# Patient Record
Sex: Female | Born: 1975 | Race: Black or African American | Hispanic: No | Marital: Single | Smoking: Current every day smoker
Health system: Southern US, Community
[De-identification: ages and names within clinical notes are randomized; demographics above are authoritative.]

## PROBLEM LIST (undated history)

## (undated) DIAGNOSIS — E119 Type 2 diabetes mellitus without complications: Secondary | ICD-10-CM

## (undated) DIAGNOSIS — E785 Hyperlipidemia, unspecified: Secondary | ICD-10-CM

## (undated) DIAGNOSIS — I10 Essential (primary) hypertension: Secondary | ICD-10-CM

---

## 2017-07-17 ENCOUNTER — Encounter (HOSPITAL_COMMUNITY): Payer: Self-pay | Admitting: Emergency Medicine

## 2017-07-17 ENCOUNTER — Other Ambulatory Visit: Payer: Self-pay

## 2017-07-17 DIAGNOSIS — D649 Anemia, unspecified: Secondary | ICD-10-CM | POA: Diagnosis present

## 2017-07-17 DIAGNOSIS — Z794 Long term (current) use of insulin: Secondary | ICD-10-CM

## 2017-07-17 DIAGNOSIS — I10 Essential (primary) hypertension: Secondary | ICD-10-CM | POA: Diagnosis present

## 2017-07-17 DIAGNOSIS — T383X6A Underdosing of insulin and oral hypoglycemic [antidiabetic] drugs, initial encounter: Secondary | ICD-10-CM | POA: Diagnosis present

## 2017-07-17 DIAGNOSIS — Z91128 Patient's intentional underdosing of medication regimen for other reason: Secondary | ICD-10-CM

## 2017-07-17 DIAGNOSIS — E785 Hyperlipidemia, unspecified: Secondary | ICD-10-CM | POA: Diagnosis present

## 2017-07-17 DIAGNOSIS — E1165 Type 2 diabetes mellitus with hyperglycemia: Principal | ICD-10-CM | POA: Diagnosis present

## 2017-07-17 DIAGNOSIS — F1010 Alcohol abuse, uncomplicated: Secondary | ICD-10-CM | POA: Diagnosis present

## 2017-07-17 DIAGNOSIS — F17200 Nicotine dependence, unspecified, uncomplicated: Secondary | ICD-10-CM | POA: Diagnosis present

## 2017-07-17 LAB — COMPREHENSIVE METABOLIC PANEL
ALK PHOS: 97 U/L (ref 38–126)
ALT: 40 U/L (ref 14–54)
ANION GAP: 13 (ref 5–15)
AST: 44 U/L — ABNORMAL HIGH (ref 15–41)
Albumin: 3.3 g/dL — ABNORMAL LOW (ref 3.5–5.0)
BUN: 10 mg/dL (ref 6–20)
CALCIUM: 8.7 mg/dL — AB (ref 8.9–10.3)
CO2: 17 mmol/L — ABNORMAL LOW (ref 22–32)
Chloride: 97 mmol/L — ABNORMAL LOW (ref 101–111)
Creatinine, Ser: 0.95 mg/dL (ref 0.44–1.00)
GFR calc Af Amer: >60 mL/min (ref >60)
Glucose, Bld: 696 mg/dL (ref 65–99)
POTASSIUM: 4.7 mmol/L (ref 3.5–5.1)
Sodium: 127 mmol/L — ABNORMAL LOW (ref 135–145)
TOTAL PROTEIN: 6.3 g/dL — AB (ref 6.5–8.1)
Total Bilirubin: 0.5 mg/dL (ref 0.3–1.2)

## 2017-07-17 LAB — CBC
HEMATOCRIT: 34.1 % — AB (ref 36.0–46.0)
HEMOGLOBIN: 10.8 g/dL — AB (ref 12.0–15.0)
MCH: 25.9 pg — ABNORMAL LOW (ref 26.0–34.0)
MCHC: 31.7 g/dL (ref 30.0–36.0)
MCV: 81.8 fL (ref 78.0–100.0)
Platelets: 350 10*3/uL (ref 150–400)
RBC: 4.17 MIL/uL (ref 3.87–5.11)
RDW: 15.7 % — AB (ref 11.5–15.5)
WBC: 4.9 10*3/uL (ref 4.0–10.5)

## 2017-07-17 LAB — URINALYSIS, ROUTINE W REFLEX MICROSCOPIC
BILIRUBIN URINE: NEGATIVE
Bacteria, UA: NONE SEEN
HGB URINE DIPSTICK: NEGATIVE
KETONES UR: NEGATIVE mg/dL
LEUKOCYTES UA: NEGATIVE
NITRITE: NEGATIVE
PH: 6 (ref 5.0–8.0)
Protein, ur: NEGATIVE mg/dL
RBC / HPF: NONE SEEN RBC/hpf (ref 0–5)
SPECIFIC GRAVITY, URINE: 1.023 (ref 1.005–1.030)

## 2017-07-17 LAB — LIPASE, BLOOD: Lipase: 28 U/L (ref 11–51)

## 2017-07-17 LAB — I-STAT BETA HCG BLOOD, ED (MC, WL, AP ONLY): I-stat hCG, quantitative: <5 m[IU]/mL (ref <5)

## 2017-07-17 NOTE — ED Triage Notes (Signed)
Patient with abdominal pain since yesterday, she took ibuprofen and it was going and coming.  Today she is having more pain, she states that it is on the lower left quadrant.  She does have some nausea, no vomiting.

## 2017-07-17 NOTE — ED Provider Notes (Signed)
Patient placed in Quick Look pathway, seen and evaluated   Chief Complaint: Abdominal pain  HPI:  Patient with LLQ pain which began yesterday. Tried ibuprofen and tylenol without relief. Pain acutely worsened today. It is constant and radiates to the left flank and back at times. She has nausea, no vomiting. Denies fever, chills, urinary symptoms.   ROS: no fevers  Physical Exam:   Gen: No distress  Neuro: Awake and Alert  Skin: Warm    Focused Exam: Abdomen soft. Bowel sounds normal. Tenderness in LLQ, no guarding or rigidity.    Initiation of care has begun. The patient has been counseled on the process, plan, and necessity for staying for the completion/evaluation, and the remainder of the medical screening examination    Lawrence MarseillesShrosbree, Emily J, PA-C 07/17/17 2114    Mancel BaleWentz, Elliott, MD 07/18/17 1046

## 2017-07-18 ENCOUNTER — Other Ambulatory Visit (HOSPITAL_COMMUNITY): Payer: Self-pay

## 2017-07-18 ENCOUNTER — Emergency Department (HOSPITAL_COMMUNITY): Payer: Self-pay

## 2017-07-18 ENCOUNTER — Encounter (HOSPITAL_COMMUNITY): Payer: Self-pay | Admitting: Emergency Medicine

## 2017-07-18 ENCOUNTER — Observation Stay (HOSPITAL_COMMUNITY): Payer: Self-pay

## 2017-07-18 ENCOUNTER — Inpatient Hospital Stay (HOSPITAL_COMMUNITY)
Admission: EM | Admit: 2017-07-18 | Discharge: 2017-07-20 | DRG: 639 | Disposition: A | Discharge status: Home / Self Care | Payer: Self-pay | Attending: Internal Medicine | Admitting: Internal Medicine

## 2017-07-18 DIAGNOSIS — D649 Anemia, unspecified: Secondary | ICD-10-CM | POA: Diagnosis present

## 2017-07-18 DIAGNOSIS — R52 Pain, unspecified: Secondary | ICD-10-CM

## 2017-07-18 DIAGNOSIS — E0865 Diabetes mellitus due to underlying condition with hyperglycemia: Secondary | ICD-10-CM | POA: Diagnosis present

## 2017-07-18 DIAGNOSIS — E1110 Type 2 diabetes mellitus with ketoacidosis without coma: Secondary | ICD-10-CM | POA: Diagnosis present

## 2017-07-18 DIAGNOSIS — IMO0002 Reserved for concepts with insufficient information to code with codable children: Secondary | ICD-10-CM | POA: Diagnosis present

## 2017-07-18 DIAGNOSIS — R739 Hyperglycemia, unspecified: Secondary | ICD-10-CM

## 2017-07-18 DIAGNOSIS — K5900 Constipation, unspecified: Secondary | ICD-10-CM

## 2017-07-18 DIAGNOSIS — I10 Essential (primary) hypertension: Secondary | ICD-10-CM | POA: Diagnosis present

## 2017-07-18 DIAGNOSIS — E785 Hyperlipidemia, unspecified: Secondary | ICD-10-CM | POA: Diagnosis present

## 2017-07-18 HISTORY — DX: Essential (primary) hypertension: I10

## 2017-07-18 HISTORY — DX: Type 2 diabetes mellitus without complications: E11.9

## 2017-07-18 HISTORY — DX: Hyperlipidemia, unspecified: E78.5

## 2017-07-18 LAB — I-STAT CHEM 8, ED
BUN: 12 mg/dL (ref 6–20)
CALCIUM ION: 1.14 mmol/L — AB (ref 1.15–1.40)
Chloride: 98 mmol/L — ABNORMAL LOW (ref 101–111)
Creatinine, Ser: 0.6 mg/dL (ref 0.44–1.00)
Glucose, Bld: 484 mg/dL — ABNORMAL HIGH (ref 65–99)
HEMATOCRIT: 34 % — AB (ref 36.0–46.0)
HEMOGLOBIN: 11.6 g/dL — AB (ref 12.0–15.0)
Potassium: 4.2 mmol/L (ref 3.5–5.1)
SODIUM: 133 mmol/L — AB (ref 135–145)
TCO2: 22 mmol/L (ref 22–32)

## 2017-07-18 LAB — BASIC METABOLIC PANEL
Anion gap: 15 (ref 5–15)
Anion gap: 12 (ref 5–15)
Anion gap: 11 (ref 5–15)
Anion gap: 12 (ref 5–15)
Anion gap: 9 (ref 5–15)
BUN: 12 mg/dL (ref 6–20)
BUN: 11 mg/dL (ref 6–20)
BUN: 15 mg/dL (ref 6–20)
BUN: 14 mg/dL (ref 6–20)
BUN: 12 mg/dL (ref 6–20)
CALCIUM: 9.1 mg/dL (ref 8.9–10.3)
CALCIUM: 8.6 mg/dL — AB (ref 8.9–10.3)
CALCIUM: 8.7 mg/dL — AB (ref 8.9–10.3)
CALCIUM: 8.7 mg/dL — AB (ref 8.9–10.3)
CALCIUM: 8.9 mg/dL (ref 8.9–10.3)
CO2: 18 mmol/L — AB (ref 22–32)
CO2: 17 mmol/L — ABNORMAL LOW (ref 22–32)
CO2: 20 mmol/L — ABNORMAL LOW (ref 22–32)
CO2: 18 mmol/L — ABNORMAL LOW (ref 22–32)
CO2: 21 mmol/L — ABNORMAL LOW (ref 22–32)
CREATININE: 0.71 mg/dL (ref 0.44–1.00)
CREATININE: 0.66 mg/dL (ref 0.44–1.00)
CREATININE: 0.66 mg/dL (ref 0.44–1.00)
CREATININE: 0.66 mg/dL (ref 0.44–1.00)
CREATININE: 0.73 mg/dL (ref 0.44–1.00)
Chloride: 99 mmol/L — ABNORMAL LOW (ref 101–111)
Chloride: 104 mmol/L (ref 101–111)
Chloride: 107 mmol/L (ref 101–111)
Chloride: 104 mmol/L (ref 101–111)
Chloride: 101 mmol/L (ref 101–111)
GFR calc non Af Amer: >60 mL/min (ref >60)
GFR calc non Af Amer: >60 mL/min (ref >60)
GFR calc non Af Amer: >60 mL/min (ref >60)
GFR calc non Af Amer: >60 mL/min (ref >60)
Glucose, Bld: 473 mg/dL — ABNORMAL HIGH (ref 65–99)
Glucose, Bld: 347 mg/dL — ABNORMAL HIGH (ref 65–99)
Glucose, Bld: 106 mg/dL — ABNORMAL HIGH (ref 65–99)
Glucose, Bld: 304 mg/dL — ABNORMAL HIGH (ref 65–99)
Glucose, Bld: 357 mg/dL — ABNORMAL HIGH (ref 65–99)
Potassium: 4.2 mmol/L (ref 3.5–5.1)
Potassium: 3.9 mmol/L (ref 3.5–5.1)
Potassium: 4.2 mmol/L (ref 3.5–5.1)
Potassium: 4.9 mmol/L (ref 3.5–5.1)
Potassium: 4.6 mmol/L (ref 3.5–5.1)
SODIUM: 132 mmol/L — AB (ref 135–145)
SODIUM: 133 mmol/L — AB (ref 135–145)
SODIUM: 138 mmol/L (ref 135–145)
SODIUM: 134 mmol/L — AB (ref 135–145)
SODIUM: 131 mmol/L — AB (ref 135–145)

## 2017-07-18 LAB — CBG MONITORING, ED
GLUCOSE-CAPILLARY: 130 mg/dL — AB (ref 65–99)
GLUCOSE-CAPILLARY: 280 mg/dL — AB (ref 65–99)
Glucose-Capillary: 205 mg/dL — ABNORMAL HIGH (ref 65–99)
Glucose-Capillary: 140 mg/dL — ABNORMAL HIGH (ref 65–99)

## 2017-07-18 LAB — RETICULOCYTES
RBC.: 3.97 MIL/uL (ref 3.87–5.11)
Retic Count, Absolute: 63.5 10*3/uL (ref 19.0–186.0)
Retic Ct Pct: 1.6 % (ref 0.4–3.1)

## 2017-07-18 LAB — TROPONIN I

## 2017-07-18 LAB — I-STAT VENOUS BLOOD GAS, ED
Acid-base deficit: 5 mmol/L — ABNORMAL HIGH (ref 0.0–2.0)
Bicarbonate: 20.8 mmol/L (ref 20.0–28.0)
O2 Saturation: 93 %
PCO2 VEN: 41.7 mmHg — AB (ref 44.0–60.0)
PH VEN: 7.305 (ref 7.250–7.430)
TCO2: 22 mmol/L (ref 22–32)
pO2, Ven: 72 mmHg — ABNORMAL HIGH (ref 32.0–45.0)

## 2017-07-18 LAB — HEMOGLOBIN A1C
HEMOGLOBIN A1C: 11.4 % — AB (ref 4.8–5.6)
Hgb A1c MFr Bld: 11.3 % — ABNORMAL HIGH (ref 4.8–5.6)
MEAN PLASMA GLUCOSE: 277.61 mg/dL
Mean Plasma Glucose: 280.48 mg/dL

## 2017-07-18 LAB — VITAMIN B12: Vitamin B-12: 610 pg/mL (ref 180–914)

## 2017-07-18 LAB — GLUCOSE, CAPILLARY
Glucose-Capillary: 317 mg/dL — ABNORMAL HIGH (ref 65–99)
Glucose-Capillary: 183 mg/dL — ABNORMAL HIGH (ref 65–99)

## 2017-07-18 LAB — RAPID URINE DRUG SCREEN, HOSP PERFORMED
AMPHETAMINES: NOT DETECTED
Barbiturates: NOT DETECTED
Benzodiazepines: NOT DETECTED
Cocaine: NOT DETECTED
OPIATES: NOT DETECTED
Tetrahydrocannabinol: NOT DETECTED

## 2017-07-18 LAB — IRON AND TIBC
IRON: 28 ug/dL (ref 28–170)
Saturation Ratios: 7 % — ABNORMAL LOW (ref 10.4–31.8)
TIBC: 389 ug/dL (ref 250–450)
UIBC: 361 ug/dL

## 2017-07-18 LAB — CBC
HCT: 31.9 % — ABNORMAL LOW (ref 36.0–46.0)
Hemoglobin: 10.1 g/dL — ABNORMAL LOW (ref 12.0–15.0)
MCH: 25.4 pg — ABNORMAL LOW (ref 26.0–34.0)
MCHC: 31.7 g/dL (ref 30.0–36.0)
MCV: 80.4 fL (ref 78.0–100.0)
PLATELETS: 343 10*3/uL (ref 150–400)
RBC: 3.97 MIL/uL (ref 3.87–5.11)
RDW: 15.2 % (ref 11.5–15.5)
WBC: 5.2 10*3/uL (ref 4.0–10.5)

## 2017-07-18 LAB — FERRITIN: FERRITIN: 7 ng/mL — AB (ref 11–307)

## 2017-07-18 LAB — FOLATE: FOLATE: 14.1 ng/mL (ref >5.9)

## 2017-07-18 LAB — HIV ANTIBODY (ROUTINE TESTING W REFLEX): HIV Screen 4th Generation wRfx: NONREACTIVE

## 2017-07-18 MED ORDER — SODIUM CHLORIDE 0.9 % IV BOLUS (SEPSIS)
1000.0000 mL | Freq: Once | INTRAVENOUS | Status: AC
  Administered 2017-07-18: 1000 mL via INTRAVENOUS

## 2017-07-18 MED ORDER — TRAMADOL HCL 50 MG PO TABS
50.0000 mg | ORAL_TABLET | Freq: Four times a day (QID) | ORAL | Status: DC | PRN
  Administered 2017-07-18 – 2017-07-20 (×8): 50 mg via ORAL
  Filled 2017-07-18 (×9): qty 1

## 2017-07-18 MED ORDER — INSULIN ASPART 100 UNIT/ML ~~LOC~~ SOLN: 0.0000 [IU] | Freq: Every day | SUBCUTANEOUS | Status: DC

## 2017-07-18 MED ORDER — VITAMIN B-1 100 MG PO TABS
100.0000 mg | ORAL_TABLET | Freq: Every day | ORAL | Status: DC
  Administered 2017-07-18 – 2017-07-20 (×3): 100 mg via ORAL
  Filled 2017-07-18 (×3): qty 1

## 2017-07-18 MED ORDER — THIAMINE HCL 100 MG/ML IJ SOLN
100.0000 mg | Freq: Every day | INTRAMUSCULAR | Status: DC
  Filled 2017-07-18: qty 2

## 2017-07-18 MED ORDER — LORAZEPAM 1 MG PO TABS: 1.0000 mg | ORAL_TABLET | Freq: Four times a day (QID) | ORAL | Status: DC | PRN

## 2017-07-18 MED ORDER — ATORVASTATIN CALCIUM 40 MG PO TABS
40.0000 mg | ORAL_TABLET | Freq: Every day | ORAL | Status: DC
  Administered 2017-07-18 – 2017-07-19 (×2): 40 mg via ORAL
  Filled 2017-07-18 (×3): qty 1

## 2017-07-18 MED ORDER — INSULIN ASPART 100 UNIT/ML ~~LOC~~ SOLN
0.0000 [IU] | Freq: Three times a day (TID) | SUBCUTANEOUS | Status: DC
  Administered 2017-07-18: 11 [IU] via SUBCUTANEOUS
  Administered 2017-07-18 – 2017-07-19 (×2): 8 [IU] via SUBCUTANEOUS
  Administered 2017-07-19: 15 [IU] via SUBCUTANEOUS
  Administered 2017-07-20: 5 [IU] via SUBCUTANEOUS
  Administered 2017-07-20: 11 [IU] via SUBCUTANEOUS
  Filled 2017-07-18: qty 1

## 2017-07-18 MED ORDER — SODIUM CHLORIDE 0.9 % IV SOLN
INTRAVENOUS | Status: DC
  Administered 2017-07-18: 5.4 [IU]/h via INTRAVENOUS
  Filled 2017-07-18: qty 1

## 2017-07-18 MED ORDER — INSULIN GLARGINE 100 UNIT/ML ~~LOC~~ SOLN
50.0000 [IU] | Freq: Every day | SUBCUTANEOUS | Status: DC
  Administered 2017-07-18: 50 [IU] via SUBCUTANEOUS
  Filled 2017-07-18 (×2): qty 0.5

## 2017-07-18 MED ORDER — DEXTROSE-NACL 5-0.45 % IV SOLN
INTRAVENOUS | Status: DC
  Administered 2017-07-18: 06:00:00 via INTRAVENOUS

## 2017-07-18 MED ORDER — POTASSIUM CHLORIDE 10 MEQ/100ML IV SOLN
10.0000 meq | INTRAVENOUS | Status: DC
  Administered 2017-07-18: 10 meq via INTRAVENOUS
  Filled 2017-07-18: qty 100

## 2017-07-18 MED ORDER — ADULT MULTIVITAMIN W/MINERALS CH
1.0000 | ORAL_TABLET | Freq: Every day | ORAL | Status: DC
  Administered 2017-07-18 – 2017-07-20 (×3): 1 via ORAL
  Filled 2017-07-18 (×3): qty 1

## 2017-07-18 MED ORDER — KETOROLAC TROMETHAMINE 30 MG/ML IJ SOLN
30.0000 mg | Freq: Once | INTRAMUSCULAR | Status: AC
  Administered 2017-07-18: 30 mg via INTRAVENOUS
  Filled 2017-07-18: qty 1

## 2017-07-18 MED ORDER — SODIUM CHLORIDE 0.9 % IV SOLN
INTRAVENOUS | Status: DC
  Administered 2017-07-18: 4.1 [IU]/h via INTRAVENOUS
  Filled 2017-07-18: qty 1

## 2017-07-18 MED ORDER — ENOXAPARIN SODIUM 40 MG/0.4ML ~~LOC~~ SOLN
40.0000 mg | SUBCUTANEOUS | Status: DC
  Administered 2017-07-19 – 2017-07-20 (×2): 40 mg via SUBCUTANEOUS
  Filled 2017-07-18 (×3): qty 0.4

## 2017-07-18 MED ORDER — LORAZEPAM 2 MG/ML IJ SOLN: 1.0000 mg | Freq: Four times a day (QID) | INTRAMUSCULAR | Status: DC | PRN

## 2017-07-18 MED ORDER — LISINOPRIL 10 MG PO TABS
10.0000 mg | ORAL_TABLET | Freq: Every day | ORAL | Status: DC
  Administered 2017-07-18 – 2017-07-20 (×3): 10 mg via ORAL
  Filled 2017-07-18 (×3): qty 1

## 2017-07-18 MED ORDER — FOLIC ACID 1 MG PO TABS
1.0000 mg | ORAL_TABLET | Freq: Every day | ORAL | Status: DC
  Administered 2017-07-18 – 2017-07-20 (×3): 1 mg via ORAL
  Filled 2017-07-18 (×3): qty 1

## 2017-07-18 MED ORDER — LIVING WELL WITH DIABETES BOOK
Freq: Once | Status: AC
Start: 2017-07-18 — End: 2017-07-18
  Administered 2017-07-18: 20:00:00
  Filled 2017-07-18: qty 1

## 2017-07-18 MED ORDER — SODIUM CHLORIDE 0.9 % IV SOLN
INTRAVENOUS | Status: DC
  Administered 2017-07-18: 05:00:00 via INTRAVENOUS

## 2017-07-18 MED ORDER — SODIUM CHLORIDE 0.9 % IV SOLN
INTRAVENOUS | Status: DC
  Administered 2017-07-18: 04:00:00 via INTRAVENOUS

## 2017-07-18 NOTE — Progress Notes (Addendum)
Inpatient Diabetes Program Recommendations  AACE/ADA: New Consensus Statement on Inpatient Glycemic Control (2015)  Target Ranges:  Prepandial:   less than 140 mg/dL      Peak postprandial:   less than 180 mg/dL (1-2 hours)      Critically ill patients:  140 - 180 mg/dL   Lab Results  Component Value Date   GLUCAP 280 (H) 07/18/2017   HGBA1C 11.4 (H) 07/18/2017    Review of Glycemic ControlResults for Cicero DuckBAXTER, Mistina (MRN [id number]) as of 07/18/2017 15:26  Ref. Range 07/18/2017 01:09 07/18/2017 06:10 07/18/2017 07:28 07/18/2017 08:47 07/18/2017 12:16  Glucose-Capillary Latest Ref Range: 65 - 99 mg/dL >829>600 (HH) 562205 (H) 130140 (H) 130 (H) 280 (H)    Diabetes history: Type 2 DM Outpatient Diabetes medications:  Novolog 3 units tid with meals, Lantus 60 units q AM Current orders for Inpatient glycemic control:  Novolog moderate tid with meals and HS, Lantus 60 units daily  Inpatient Diabetes Program Recommendations:    Spoke with patient regarding DM.  At first patient states that she does not want to talk about DM.  We however talked a little while longer and patient opened up.  She states that she has only had DM a few months.  She [place] Placed referral for Care management regarding medications/insurance.  She states that she has thrown away her meter b/c it kept reading "high".  She states that her mother recently passed away and she has been drinking every day.  We briefly discussed that beer is not good for DM management.  We talked about insulin administration and she says that she was taking both Lantus and Novolog (however not consistently).  We discussed rotation of insulin injection sites and she verbalized understanding stating that she has noticed that a "lump" forms when she goes in the same site.  I have ordered Living well with DM booklet and will ask RN to assist patient in watching DM videos.  May need adjustment in medications at D/C? Will need  to make sure that she can get medications at d/c and will need f/u with PCP.  Please consider adding Novolog meal coverage 5 units tid with meals.   -MD, at d/c patient will need Rx. For glucometer as well.  Thanks,  Beryl MeagerJenny Kawon Willcutt, RN, BC-ADM Inpatient Diabetes Coordinator Pager [phone] (8a-5p)

## 2017-07-18 NOTE — H&P (Signed)
History and Physical    Candice Fernandez UJW:[id number] DOB: 1975/07/19 DOA: 07/18/2017  PCP: Patient, No Pcp Per  Patient coming from: Home.  Chief Complaint: Left lower quadrant pain.  HPI: Candice Fernandez is a 42 y.o. female with history of recently diagnosed diabetes mellitus type 2 months ago and newborn went patient states she is in diabetic coma and was eventually discharged home on Lantus which patient states she takes every morning presents to the ER after patient has been a left lower quadrant abdominal pain last few days which has been worsening denies any associated nausea vomiting diarrhea.  Denies any fever chills.  ED Course: In the ER CT renal study shows nothing acute.  Patient's lab work show patient's blood sugar is around 600 with a anion gap 13.  Patient was started on IV fluids and IV insulin infusion.  UA was unremarkable.  Patient is being admitted for uncontrolled diabetes mellitus with early DKA.  Review of Systems: As per HPI, rest all negative.   Past Medical History:  Diagnosis Date  . Diabetes mellitus without complication (HCC)   . Hyperlipidemia   . Hypertension     Past Surgical History:  Procedure Laterality Date  . CESAREAN SECTION       reports that she has been smoking.  she has never used smokeless tobacco. She reports that she drinks alcohol. She reports that she does not use drugs.  No Known Allergies  Family History  Problem Relation Age of Onset  . Hypertension Mother     Prior to Admission medications   Medication Sig Start Date End Date Taking? Authorizing Provider  insulin aspart (NOVOLOG) 100 UNIT/ML injection Inject into the skin 3 (three) times daily before meals.   Yes [provider]  insulin glargine (LANTUS) 100 UNIT/ML injection Inject 60 Units into the skin every morning.   Yes [provider]    Physical Exam: Vitals:   07/18/17 0115  BP: 132/86  Pulse: (!) 117  Resp: 16  SpO2: 98%       Constitutional: Moderately built and nourished. Vitals:   07/18/17 0115  BP: 132/86  Pulse: (!) 117  Resp: 16  SpO2: 98%   Eyes: Anicteric no pallor. ENMT: No discharge from the ears eyes nose or mouth. Neck: No mass palpated no neck rigidity. Respiratory: No rhonchi or crepitations. Cardiovascular: S1-S2 heard no murmurs appreciated. Abdomen: Soft nontender bowel sounds present.  No guarding or rigidity. Musculoskeletal: No edema.  No joint effusion. Skin: No rash.  Skin appears warm. Neurologic: Alert awake oriented to time place and person.  Moves all extremities. Psychiatric: Appears normal.  Normal affect.   Labs on Admission: I have personally reviewed following labs and imaging studies  CBC: Recent Labs  Lab 07/17/17 2119  WBC 4.9  HGB 10.8*  HCT 34.1*  MCV 81.8  PLT 350   Basic Metabolic Panel: Recent Labs  Lab 07/17/17 2119  NA 127*  K 4.7  CL 97*  CO2 17*  GLUCOSE 696*  BUN 10  CREATININE 0.95  CALCIUM 8.7*   GFR: CrCl cannot be calculated (Unknown ideal weight.). Liver Function Tests: Recent Labs  Lab 07/17/17 2119  AST 44*  ALT 40  ALKPHOS 97  BILITOT 0.5  PROT 6.3*  ALBUMIN 3.3*   Recent Labs  Lab 07/17/17 2119  LIPASE 28   No results for input(s): AMMONIA in the last 168 hours. Coagulation Profile: No results for input(s): INR, PROTIME in the last 168 hours. Cardiac  Enzymes: No results for input(s): CKTOTAL, CKMB, CKMBINDEX, TROPONINI in the last 168 hours. BNP (last 3 results) No results for input(s): PROBNP in the last 8760 hours. HbA1C: No results for input(s): HGBA1C in the last 72 hours. CBG: Recent Labs  Lab 07/18/17 0109  GLUCAP >600*   Lipid Profile: No results for input(s): CHOL, HDL, LDLCALC, TRIG, CHOLHDL, LDLDIRECT in the last 72 hours. Thyroid Function Tests: No results for input(s): TSH, T4TOTAL, FREET4, T3FREE, THYROIDAB in the last 72 hours. Anemia Panel: No results for input(s): VITAMINB12,  FOLATE, FERRITIN, TIBC, IRON, RETICCTPCT in the last 72 hours. Urine analysis:    Component Value Date/Time   COLORURINE COLORLESS (A) 07/17/2017 2120   APPEARANCEUR CLEAR 07/17/2017 2120   LABSPEC 1.023 07/17/2017 2120   PHURINE 6.0 07/17/2017 2120   GLUCOSEU >=500 (A) 07/17/2017 2120   HGBUR NEGATIVE 07/17/2017 2120   BILIRUBINUR NEGATIVE 07/17/2017 2120   KETONESUR NEGATIVE 07/17/2017 2120   PROTEINUR NEGATIVE 07/17/2017 2120   NITRITE NEGATIVE 07/17/2017 2120   LEUKOCYTESUR NEGATIVE 07/17/2017 2120   Sepsis Labs: @LABRCNTIP (procalcitonin:4,lacticidven:4) )No results found for this or any previous visit (from the past 240 hour(s)).   Radiological Exams on Admission: Ct Renal Stone Study  Result Date: 07/18/2017 CLINICAL DATA:  Left-sided abdominal pain. EXAM: CT ABDOMEN AND PELVIS WITHOUT CONTRAST TECHNIQUE: Multidetector CT imaging of the abdomen and pelvis was performed following the standard protocol without IV contrast. COMPARISON:  None. FINDINGS: Lower chest: Linear atelectasis in the lung bases. No consolidation. No pleural effusion. Hepatobiliary: No focal hepatic lesion allowing for lack contrast. Gallbladder is nondistended. No calcified gallstone. Pancreas: No ductal dilatation or inflammation. Spleen: Normal in size without focal abnormality. Adrenals/Urinary Tract: No adrenal nodule. No hydronephrosis or perinephric edema. No urolithiasis. Distended urinary bladder without wall thickening or stone. Stomach/Bowel: Stomach physiologically distended with ingested contents. Probable small duodenal diverticulum. No small bowel obstruction, wall thickening or inflammation. Normal appendix. Moderate colonic stool burden with colonic tortuosity. No colonic wall thickening or inflammation. Vascular/Lymphatic: Minimal aortic atherosclerosis. No aneurysm. No bulky abdominal or pelvic adenopathy. Reproductive: Calcified fibroid about the left uterine fundus. No adnexal mass. Other: Soft  tissue densities in the anterior abdominal wall typically seen with injection sites. Tiny fat containing umbilical hernia. No intra-abdominal ascites. No free air. No intra-abdominal abscess. Musculoskeletal: There are no acute or suspicious osseous abnormalities. IMPRESSION: 1. No renal stones or obstructive uropathy. 2. Moderate colonic stool burden, can be seen with constipation. 3. Calcified uterine fibroid. 4. Minimal Aortic Atherosclerosis (ICD10-I70.0). Electronically Signed   By: Rubye Oaks M.D.   On: 07/18/2017 03:45     Assessment/Plan Principal Problem:   DKA, type 2 (HCC) Active Problems:   Normocytic normochromic anemia   Essential hypertension   HLD (hyperlipidemia)    1. Uncontrolled diabetes mellitus type 2 with early DKA -we will check hemoglobin A1c.  Patient is on IV insulin infusion and fluids.  Closely follow metabolic panel until anion gap gets corrected. 2. Left lower quadrant pain -CT renal stone study does not show anything acute.  Patient complains of left lower quadrant pain which radiates to the left flank.  UA is unremarkable patient is afebrile.  Will get pelvic ultrasound since patient is still complaining of pain. 3. Hypertension on lisinopril. 4. Hyperlipidemia on statins. 5. History of alcoholism and tobacco abuse -advised to quit these habits and patient is placed on CIWA protocol. 6. Normocytic normochromic anemia will check anemia panel follow CBC.  EKG is pending.   DVT prophylaxis:  Lovenox. Code Status: Full code. Family Communication: Discussed with patient. Disposition Plan: Home. Consults called: None. Admission status: Observation.   Eduard ClosArshad N Carolan Avedisian MD [facility] Pager [phone].  If 7PM-7AM, please contact night-coverage [email or web address] Password [email or web address]  07/18/2017, 4:08 AM

## 2017-07-18 NOTE — Progress Notes (Signed)
Patient seen and examined this morning, admitted overnight by Dr. Gilford SilviusKakrakandi, H&P reviewed and agree with A/P  In brief, pleasant 42 year old female with recently diagnosed diabetes mellitus, who presented to the emergency room with left lower quadrant abdominal pain for the past few days, was found to have elevated CBGs in the 600, started on IV insulin and was admitted to the hospital   Uncontrolled diabetes mellitus type 2 with early DKA -CBGs much better, anion gap is normal, discontinue insulin infusion and resume home Lantus -Patient does state that she has not been taking her Lantus every day, likely resulting in her hyperglycemia on admission -Diabetes coordinator consulted -Changed to MedSurg since her insulin infusion is to be stopped  Left lower quadrant pain -CT renal stone normal, pelvic ultrasound pending -Urinalysis negative, she is afebrile  Hypertension -Continue lisinopril  Hyperlipidemia -Continue statins  History of alcohol and tobacco use -Continue CIWA  Normocytic normochromic anemia -No bleeding, follow   Costin M. Elvera LennoxGherghe, MD [facility] [phone]  If 7PM-7AM, please contact night-coverage [email or web address] Password [email or web address]

## 2017-07-18 NOTE — ED Notes (Signed)
Pt care assumed, verbal report obtained.  Pt is A&Ox 4.  Ambulatory without difficulty.  Breakfast tray ordered.  She is sitting in a chair next to her bed.  She states it relieves her back pain.

## 2017-07-18 NOTE — ED Notes (Signed)
Patient transported to Ultrasound 

## 2017-07-18 NOTE — ED Notes (Signed)
CBG HIGH >600, RN notified

## 2017-07-18 NOTE — ED Provider Notes (Signed)
[facility] EMERGENCY DEPARTMENT Provider Note   CSN: [id number] Arrival date & time: 07/17/17  2052     History   Chief Complaint Chief Complaint  Patient presents with  . Abdominal Pain    HPI Candice Fernandez is a 42 y.o. female.  The history is provided by the patient.  Abdominal Pain   This is a new problem. The current episode started more than 2 days ago. The problem occurs constantly. The problem has not changed since onset.The pain is associated with an unknown factor. The pain is located in the LLQ. The pain is moderate. Pertinent negatives include anorexia, fever, belching, diarrhea, flatus, hematochezia, melena, nausea, vomiting, constipation, dysuria, frequency, hematuria, headaches, arthralgias and myalgias. Associated symptoms comments: New diabetes with frequency . Nothing aggravates the symptoms. Nothing relieves the symptoms. Past workup does not include surgery. Her past medical history does not include ulcerative colitis or Crohn's disease.  Hyperglycemia  Blood sugar level PTA:  Too high Severity:  Severe Onset quality:  Gradual Timing:  Constant Progression:  Unchanged Chronicity:  New Context: new diabetes diagnosis   Relieved by:  Nothing Ineffective treatments:  None tried Associated symptoms: abdominal pain and polyuria   Associated symptoms: no dysuria, no fever, no nausea and no vomiting   Risk factors: no family hx of diabetes     Past Medical History:  Diagnosis Date  . Diabetes mellitus without complication (HCC)   . Hyperlipidemia   . Hypertension     There are no active problems to display for this patient.   History reviewed. No pertinent surgical history.  OB History    No data available       Home Medications    Prior to Admission medications   Medication Sig Start Date End Date Taking? Authorizing Provider  insulin aspart (NOVOLOG) 100 UNIT/ML injection Inject into the skin 3 (three) times daily before meals.    Yes [provider]  insulin glargine (LANTUS) 100 UNIT/ML injection Inject 60 Units into the skin every morning.   Yes [provider]    Family History No family history on file.  Social History Social History   Tobacco Use  . Smoking status: Current Every Day Smoker  . Smokeless tobacco: Never Used  Substance Use Topics  . Alcohol use: Yes  . Drug use: No     Allergies   Patient has no known allergies.   Review of Systems Review of Systems  Constitutional: Negative for fever.  Gastrointestinal: Positive for abdominal pain. Negative for anorexia, constipation, diarrhea, flatus, hematochezia, melena, nausea and vomiting.  Endocrine: Positive for polyuria.  Genitourinary: Negative for dysuria, frequency and hematuria.  Musculoskeletal: Negative for arthralgias and myalgias.  Neurological: Negative for headaches.  All other systems reviewed and are negative.    Physical Exam Updated Vital Signs BP 132/86   Pulse (!) 117   Resp 16   LMP 07/09/2017 (Exact Date)   SpO2 98%   Physical Exam  Constitutional: She is oriented to person, place, and time. She appears well-developed and well-nourished. No distress.  HENT:  Head: Normocephalic and atraumatic.  Mouth/Throat: No oropharyngeal exudate.  Eyes: Conjunctivae are normal. Pupils are equal, round, and reactive to light.  Neck: Normal range of motion. Neck supple.  Cardiovascular: Normal rate, regular rhythm, normal heart sounds and intact distal pulses.  Pulmonary/Chest: Effort normal and breath sounds normal. No stridor. No respiratory distress. She has no wheezes. She has no rales.  Abdominal: Soft. Bowel sounds are  normal. She exhibits no mass. There is no tenderness. There is no rebound and no guarding.  Musculoskeletal: Normal range of motion.  Neurological: She is alert and oriented to person, place, and time. She displays normal reflexes.  Skin: Skin is warm and dry. Capillary refill takes  less than 2 seconds.  Psychiatric: She has a normal mood and affect.     ED Treatments / Results  Labs (all labs ordered are listed, but only abnormal results are displayed)  Results for orders placed or performed during the hospital encounter of 07/18/17  Lipase, blood  Result Value Ref Range   Lipase 28 11 - 51 U/L  Comprehensive metabolic panel  Result Value Ref Range   Sodium 127 (L) 135 - 145 mmol/L   Potassium 4.7 3.5 - 5.1 mmol/L   Chloride 97 (L) 101 - 111 mmol/L   CO2 17 (L) 22 - 32 mmol/L   Glucose, Bld 696 (HH) 65 - 99 mg/dL   BUN 10 6 - 20 mg/dL   Creatinine, Ser 1.19 0.44 - 1.00 mg/dL   Calcium 8.7 (L) 8.9 - 10.3 mg/dL   Total Protein 6.3 (L) 6.5 - 8.1 g/dL   Albumin 3.3 (L) 3.5 - 5.0 g/dL   AST 44 (H) 15 - 41 U/L   ALT 40 14 - 54 U/L   Alkaline Phosphatase 97 38 - 126 U/L   Total Bilirubin 0.5 0.3 - 1.2 mg/dL   GFR calc non Af Amer >60 >60 mL/min   GFR calc Af Amer >60 >60 mL/min   Anion gap 13 5 - 15  CBC  Result Value Ref Range   WBC 4.9 4.0 - 10.5 K/uL   RBC 4.17 3.87 - 5.11 MIL/uL   Hemoglobin 10.8 (L) 12.0 - 15.0 g/dL   HCT 14.7 (L) 82.9 - 56.2 %   MCV 81.8 78.0 - 100.0 fL   MCH 25.9 (L) 26.0 - 34.0 pg   MCHC 31.7 30.0 - 36.0 g/dL   RDW 13.0 (H) 86.5 - 78.4 %   Platelets 350 150 - 400 K/uL  Urinalysis, Routine w reflex microscopic  Result Value Ref Range   Color, Urine COLORLESS (A) YELLOW   APPearance CLEAR CLEAR   Specific Gravity, Urine 1.023 1.005 - 1.030   pH 6.0 5.0 - 8.0   Glucose, UA >=500 (A) NEGATIVE mg/dL   Hgb urine dipstick NEGATIVE NEGATIVE   Bilirubin Urine NEGATIVE NEGATIVE   Ketones, ur NEGATIVE NEGATIVE mg/dL   Protein, ur NEGATIVE NEGATIVE mg/dL   Nitrite NEGATIVE NEGATIVE   Leukocytes, UA NEGATIVE NEGATIVE   RBC / HPF NONE SEEN 0 - 5 RBC/hpf   WBC, UA 0-5 0 - 5 WBC/hpf   Bacteria, UA NONE SEEN NONE SEEN   Squamous Epithelial / LPF 0-5 (A) NONE SEEN  I-Stat beta hCG blood, ED  Result Value Ref Range   I-stat hCG,  quantitative <5.0 <5 mIU/mL   Comment 3          CBG monitoring, ED  Result Value Ref Range   Glucose-Capillary >600 (HH) 65 - 99 mg/dL   Comment 1 Notify RN    Comment 2 Document in Chart    No results found.   Procedures Procedures (including critical care time)  Medications Ordered in ED Medications  insulin regular (NOVOLIN R,HUMULIN R) 100 Units in sodium chloride 0.9 % 100 mL (1 Units/mL) infusion (not administered)  sodium chloride 0.9 % bolus 1,000 mL (1,000 mLs Intravenous New Bag/Given 07/18/17 0315)  And  sodium chloride 0.9 % bolus 1,000 mL (not administered)    And  0.9 %  sodium chloride infusion (not administered)  potassium chloride 10 mEq in 100 mL IVPB (not administered)  ketorolac (TORADOL) 30 MG/ML injection 30 mg (not administered)  sodium chloride 0.9 % bolus 1,000 mL (1,000 mLs Intravenous New Bag/Given 07/18/17 0241)      MDM Reviewed: nursing note and vitals Interpretation: labs (hyperglycemia, gap is normal   ) Total time providing critical care: 75-105 minutes (insulin drip). This excludes time spent performing separately reportable procedures and services. Consults: admitting MD  CRITICAL CARE Performed by: Jasmine AwePALUMBO-RASCH,Darrill Vreeland K Total critical care time: 75  minutes Critical care time was exclusive of separately billable procedures and treating other patients. Critical care was necessary to treat or prevent imminent or life-threatening deterioration. Critical care was time spent personally by me on the following activities: development of treatment plan with patient and/or surrogate as well as nursing, discussions with consultants, evaluation of patient's response to treatment, examination of patient, obtaining history from patient or surrogate, ordering and performing treatments and interventions, ordering and review of laboratory studies, ordering and review of radiographic studies, pulse oximetry and re-evaluation of patient's condition.  Final  Clinical Impressions(s) / ED Diagnoses   Will admit for hyperosmolar state will need education and medication adjustment.     Samaia Iwata, MD 07/18/17 [id number]

## 2017-07-19 ENCOUNTER — Other Ambulatory Visit: Payer: Self-pay

## 2017-07-19 DIAGNOSIS — E0865 Diabetes mellitus due to underlying condition with hyperglycemia: Secondary | ICD-10-CM | POA: Diagnosis present

## 2017-07-19 DIAGNOSIS — IMO0002 Reserved for concepts with insufficient information to code with codable children: Secondary | ICD-10-CM | POA: Diagnosis present

## 2017-07-19 LAB — BASIC METABOLIC PANEL
ANION GAP: 13 (ref 5–15)
BUN: 12 mg/dL (ref 6–20)
CALCIUM: 9.7 mg/dL (ref 8.9–10.3)
CO2: 21 mmol/L — ABNORMAL LOW (ref 22–32)
Chloride: 99 mmol/L — ABNORMAL LOW (ref 101–111)
Creatinine, Ser: 0.82 mg/dL (ref 0.44–1.00)
GFR calc Af Amer: >60 mL/min (ref >60)
GFR calc non Af Amer: >60 mL/min (ref >60)
GLUCOSE: 436 mg/dL — AB (ref 65–99)
Potassium: 4.6 mmol/L (ref 3.5–5.1)
Sodium: 133 mmol/L — ABNORMAL LOW (ref 135–145)

## 2017-07-19 LAB — GLUCOSE, CAPILLARY
GLUCOSE-CAPILLARY: 410 mg/dL — AB (ref 65–99)
GLUCOSE-CAPILLARY: 266 mg/dL — AB (ref 65–99)
Glucose-Capillary: 520 mg/dL (ref 65–99)
Glucose-Capillary: 255 mg/dL — ABNORMAL HIGH (ref 65–99)

## 2017-07-19 MED ORDER — INSULIN GLARGINE 100 UNIT/ML ~~LOC~~ SOLN
60.0000 [IU] | Freq: Every day | SUBCUTANEOUS | Status: DC
  Administered 2017-07-19: 60 [IU] via SUBCUTANEOUS
  Filled 2017-07-19 (×2): qty 0.6

## 2017-07-19 MED ORDER — INSULIN ASPART 100 UNIT/ML ~~LOC~~ SOLN
8.0000 [IU] | Freq: Three times a day (TID) | SUBCUTANEOUS | Status: DC
  Administered 2017-07-19 (×2): 8 [IU] via SUBCUTANEOUS

## 2017-07-19 MED ORDER — INSULIN ASPART 100 UNIT/ML ~~LOC~~ SOLN
22.0000 [IU] | Freq: Once | SUBCUTANEOUS | Status: AC
  Administered 2017-07-19: 22 [IU] via SUBCUTANEOUS

## 2017-07-19 NOTE — Progress Notes (Signed)
[facility] Progress Note  Patient: Candice Fernandez WUJ:[id number]   PCP: Patient, No Pcp Per DOB: 04-30-1976   DOA: 07/18/2017   DOS: 07/19/2017   Date of Service: the patient was seen and examined on 07/19/2017  Subjective: Feeling better.  No nausea no vomiting no fever no chills.  No abdominal pain right now.  Had multiple complaints before admission which currently resolved.  Brief hospital course: Pt. with PMH of type II DM, HTN, HLD, alcohol abuse; admitted on 07/18/2017, presented with complaint of nausea and vomiting, was found to have uncontrolled diabetes with hyperglycemia. Currently further plan is continue sugar control.  Assessment and Plan: 1.  Type 2 diabetes mellitus. Uncontrolled with hyperglycemia. Hemoglobin A1c 11.7 CBG 522 this morning.  Yesterday ranged between 132 317 after transition to Lantus. At present we will give the patient her home regimen 60 units of Lantus. Also cover with 10 units of 3 times daily before meals coverage and before meals at bedtime sliding scale. Diabetes coronary already consulted. Dietary consulted as well.  2.  HTN. Blood pressure control. Continue lisinopril.  3.  Dyslipidemia. Continue statin.  4.  History of alcohol abuse. Continue CIWA  Diet: Carb modified diet DVT Prophylaxis: subcutaneous Heparin  Advance goals of care discussion: full code  Family Communication: no family was present at bedside, at the time of interview.  Disposition:  Discharge to home.  Consultants: none Procedures: none  Antibiotics: Anti-infectives (From admission, onward)   None       Objective: Physical Exam: Vitals:   07/18/17 2204 07/19/17 0250 07/19/17 0600 07/19/17 1004  BP: 114/83 118/77 138/77 126/84  Pulse: (!) 107 (!) 103 (!) 102 (!) 110  Resp: 20 20 20 17   Temp: 98.9 F (37.2 C) 98.7 F (37.1 C) 98.6 F (37 C) 98.5 F (36.9 C)  TempSrc: Oral Oral Oral Oral  SpO2: 99% 99% 100% 99%   No intake or output data in the  24 hours ending 07/19/17 1311 There were no vitals filed for this visit. General: Alert, Awake and Oriented to Time, Place and Person. Appear in no distress, affect appropriate Eyes: PERRL, Conjunctiva normal ENT: Oral Mucosa clear moist. Neck: no JVD, no Abnormal Mass Or lumps Cardiovascular: S1 and S2 Present, no Murmur, Peripheral Pulses Present Respiratory: normal respiratory effort, Bilateral Air entry equal and Decreased, no use of accessory muscle, Clear to Auscultation, no Crackles, no wheezes Abdomen: Bowel Sound present, Soft and no tenderness, no hernia Skin: no redness, no Rash, no induration Extremities: no Pedal edema, no calf tenderness Neurologic: Grossly no focal neuro deficit. Bilaterally Equal motor strength  Data Reviewed: CBC: Recent Labs  Lab 07/17/17 2119 07/18/17 0415 07/18/17 0420  WBC 4.9  --  5.2  HGB 10.8* 11.6* 10.1*  HCT 34.1* 34.0* 31.9*  MCV 81.8  --  80.4  PLT 350  --  343   Basic Metabolic Panel: Recent Labs  Lab 07/18/17 0420 07/18/17 0950 07/18/17 1213 07/18/17 1647 07/19/17 0833  NA 133* 138 134* 131* 133*  K 3.9 4.2 4.9 4.6 4.6  CL 104 107 104 101 99*  CO2 17* 20* 18* 21* 21*  GLUCOSE 347* 106* 304* 357* 436*  BUN 11 15 14 12 12   CREATININE 0.66 0.66 0.66 0.73 0.82  CALCIUM 8.6* 8.7* 8.7* 8.9 9.7    Liver Function Tests: Recent Labs  Lab 07/17/17 2119  AST 44*  ALT 40  ALKPHOS 97  BILITOT 0.5  PROT 6.3*  ALBUMIN 3.3*   Recent  Labs  Lab 07/17/17 2119  LIPASE 28   No results for input(s): AMMONIA in the last 168 hours. Coagulation Profile: No results for input(s): INR, PROTIME in the last 168 hours. Cardiac Enzymes: Recent Labs  Lab 07/18/17 0420  TROPONINI <0.03   BNP (last 3 results) No results for input(s): PROBNP in the last 8760 hours. CBG: Recent Labs  Lab 07/18/17 1216 07/18/17 1706 07/18/17 2200 07/19/17 0646 07/19/17 1119  GLUCAP 280* 317* 183* 520* 255*   Studies: No results found.    Scheduled Meds: . atorvastatin  40 mg Oral q1800  . enoxaparin (LOVENOX) injection  40 mg Subcutaneous Q24H  . folic acid  1 mg Oral Daily  . insulin aspart  0-15 Units Subcutaneous TID WC  . insulin aspart  0-5 Units Subcutaneous QHS  . insulin aspart  8 Units Subcutaneous TID WC  . insulin glargine  60 Units Subcutaneous Daily  . lisinopril  10 mg Oral Daily  . multivitamin with minerals  1 tablet Oral Daily  . thiamine  100 mg Oral Daily   Or  . thiamine  100 mg Intravenous Daily   Continuous Infusions: PRN Meds: LORazepam **OR** LORazepam, traMADol  Time spent: 35 minutes  Author: Lynden OxfordPranav Vikas Wegmann, MD [facility] Pager: [phone] 07/19/2017 1:11 PM  If 7PM-7AM, please contact night-coverage at [email or web address], password [email or web address]

## 2017-07-19 NOTE — Plan of Care (Signed)
  RD consulted for nutrition education regarding diabetes.   Lab Results  Component Value Date   HGBA1C 11.4 (H) 07/18/2017   Spoke with patient at bedside. She describes cooking a lot of [place] food, which she learned from her grandmother. Also describes eating out a lot, getting fried chicken, that sort of thing. Patient says she dropped out of school in the 6th grade and struggles with math. Tried to help her with reading food labels, understanding carbs at meals. Also discussed plate method with patient.   RD provided "Carbohydrate Counting for People with Diabetes" handout from the Academy of Nutrition and Dietetics. Discussed different food groups and their effects on blood sugar, emphasizing carbohydrate-containing foods. Provided list of carbohydrates and recommended serving sizes of common foods.  Discussed importance of controlled and consistent carbohydrate intake throughout the day. Provided examples of ways to balance meals/snacks and encouraged intake of high-fiber, whole grain complex carbohydrates. Teach back method used.  Expect fair compliance.   Current diet order is carb modified, patient is consuming approximately 100% of meals at this time. Labs and medications reviewed. No further nutrition interventions warranted at this time. RD contact information provided. If additional nutrition issues arise, please re-consult RD.  Dionne AnoWilliam M. Kathie Posa, MS, RD LDN Inpatient Clinical Dietitian Pager [phone]

## 2017-07-19 NOTE — Progress Notes (Signed)
Pt refused 3 units novolog insulin for BG of 266 at 2129 this evening at bedtime.

## 2017-07-19 NOTE — Progress Notes (Signed)
MD on call notified of CBG 520, stat glucose ordered from lab to verify.

## 2017-07-20 LAB — GLUCOSE, CAPILLARY: Glucose-Capillary: 325 mg/dL — ABNORMAL HIGH (ref 65–99)

## 2017-07-20 LAB — BASIC METABOLIC PANEL
ANION GAP: 11 (ref 5–15)
BUN: 13 mg/dL (ref 6–20)
CHLORIDE: 100 mmol/L — AB (ref 101–111)
CO2: 21 mmol/L — ABNORMAL LOW (ref 22–32)
Calcium: 9.3 mg/dL (ref 8.9–10.3)
Creatinine, Ser: 0.78 mg/dL (ref 0.44–1.00)
GFR calc Af Amer: >60 mL/min (ref >60)
Glucose, Bld: 356 mg/dL — ABNORMAL HIGH (ref 65–99)
POTASSIUM: 4.3 mmol/L (ref 3.5–5.1)
SODIUM: 132 mmol/L — AB (ref 135–145)

## 2017-07-20 LAB — MAGNESIUM: MAGNESIUM: 1.8 mg/dL (ref 1.7–2.4)

## 2017-07-20 MED ORDER — "INSULIN SYRINGE 31G X 5/16"" 0.3 ML MISC": 1.0000 | Freq: Three times a day (TID) | 0 refills | Status: AC

## 2017-07-20 MED ORDER — INSULIN ASPART 100 UNIT/ML ~~LOC~~ SOLN
15.0000 [IU] | Freq: Three times a day (TID) | SUBCUTANEOUS | Status: DC
  Administered 2017-07-20 (×2): 15 [IU] via SUBCUTANEOUS

## 2017-07-20 MED ORDER — INSULIN GLARGINE 100 UNIT/ML ~~LOC~~ SOLN
70.0000 [IU] | Freq: Every day | SUBCUTANEOUS | Status: DC
  Administered 2017-07-20: 70 [IU] via SUBCUTANEOUS
  Filled 2017-07-20: qty 0.7

## 2017-07-20 MED ORDER — INSULIN ASPART 100 UNIT/ML ~~LOC~~ SOLN: 15.0000 [IU] | Freq: Three times a day (TID) | SUBCUTANEOUS | Status: DC

## 2017-07-20 MED ORDER — BLOOD GLUCOSE METER KIT: PACK | 0 refills | Status: AC

## 2017-07-20 MED ORDER — "INSULIN SYRINGE 31G X 5/16"" 1 ML MISC": 1.0000 | Freq: Every day | 0 refills | Status: AC

## 2017-07-20 MED ORDER — INSULIN GLARGINE 100 UNIT/ML ~~LOC~~ SOLN: 70.0000 [IU] | SUBCUTANEOUS | 11 refills | Status: AC

## 2017-07-20 MED ORDER — INSULIN ASPART 100 UNIT/ML ~~LOC~~ SOLN: 15.0000 [IU] | Freq: Three times a day (TID) | SUBCUTANEOUS | 11 refills | Status: AC

## 2017-07-20 NOTE — Progress Notes (Signed)
Pt refuses blood glucose monitoring at this time.

## 2017-07-20 NOTE — Progress Notes (Signed)
Pt refuses vital signs for the "rest of the night". Last VS taken were at 2200.

## 2017-07-20 NOTE — Care Management (Signed)
Pt given MATCH letter.  Explained to patient to call clinics to get appointment.  [facility], [facility], and [facility] info placed on AVS.    Pt very anxious to leave and states she will not wait for anything else although she states she may not be able to get meds filled till tomorrow and did not want to go the pharmacy where scripts were sent.  Offered to help patient further, but pt insists on leaving.

## 2017-07-20 NOTE — Discharge Instructions (Signed)
Diabetes Mellitus and Nutrition When you have diabetes (diabetes mellitus), it is very important to have healthy eating habits because your blood sugar (glucose) levels are greatly affected by what you eat and drink. Eating healthy foods in the appropriate amounts, at about the same times every day, can help you:  Control your blood glucose.  Lower your risk of heart disease.  Improve your blood pressure.  Reach or maintain a healthy weight.  Every person with diabetes is different, and each person has different needs for a meal plan. Your health care provider may recommend that you work with a diet and nutrition specialist (dietitian) to make a meal plan that is best for you. Your meal plan may vary depending on factors such as:  The calories you need.  The medicines you take.  Your weight.  Your blood glucose, blood pressure, and cholesterol levels.  Your activity level.  Other health conditions you have, such as heart or kidney disease.  How do carbohydrates affect me? Carbohydrates affect your blood glucose level more than any other type of food. Eating carbohydrates naturally increases the amount of glucose in your blood. Carbohydrate counting is a method for keeping track of how many carbohydrates you eat. Counting carbohydrates is important to keep your blood glucose at a healthy level, especially if you use insulin or take certain oral diabetes medicines. It is important to know how many carbohydrates you can safely have in each meal. This is different for every person. Your dietitian can help you calculate how many carbohydrates you should have at each meal and for snack. Foods that contain carbohydrates include:  Bread, cereal, rice, pasta, and crackers.  Potatoes and corn.  Peas, beans, and lentils.  Milk and yogurt.  Fruit and juice.  Desserts, such as cakes, cookies, ice cream, and candy.  How does alcohol affect me? Alcohol can cause a sudden decrease in blood  glucose (hypoglycemia), especially if you use insulin or take certain oral diabetes medicines. Hypoglycemia can be a life-threatening condition. Symptoms of hypoglycemia (sleepiness, dizziness, and confusion) are similar to symptoms of having too much alcohol. If your health care provider says that alcohol is safe for you, follow these guidelines:  Limit alcohol intake to no more than 1 drink per day for nonpregnant women and 2 drinks per day for men. One drink equals 12 oz of beer, 5 oz of wine, or 1 oz of hard liquor.  Do not drink on an empty stomach.  Keep yourself hydrated with water, diet soda, or unsweetened iced tea.  Keep in mind that regular soda, juice, and other mixers may contain a lot of sugar and must be counted as carbohydrates.  What are tips for following this plan? Reading food labels  Start by checking the serving size on the label. The amount of calories, carbohydrates, fats, and other nutrients listed on the label are based on one serving of the food. Many foods contain more than one serving per package.  Check the total grams (g) of carbohydrates in one serving. You can calculate the number of servings of carbohydrates in one serving by dividing the total carbohydrates by 15. For example, if a food has 30 g of total carbohydrates, it would be equal to 2 servings of carbohydrates.  Check the number of grams (g) of saturated and trans fats in one serving. Choose foods that have low or no amount of these fats.  Check the number of milligrams (mg) of sodium in one serving. Most people   should limit total sodium intake to less than 2,300 mg per day.  Always check the nutrition information of foods labeled as "low-fat" or "nonfat". These foods may be higher in added sugar or refined carbohydrates and should be avoided.  Talk to your dietitian to identify your daily goals for nutrients listed on the label. Shopping  Avoid buying canned, premade, or processed foods. These  foods tend to be high in fat, sodium, and added sugar.  Shop around the outside edge of the grocery store. This includes fresh fruits and vegetables, bulk grains, fresh meats, and fresh dairy. Cooking  Use low-heat cooking methods, such as baking, instead of high-heat cooking methods like deep frying.  Cook using healthy oils, such as olive, canola, or sunflower oil.  Avoid cooking with butter, cream, or high-fat meats. Meal planning  Eat meals and snacks regularly, preferably at the same times every day. Avoid going long periods of time without eating.  Eat foods high in fiber, such as fresh fruits, vegetables, beans, and whole grains. Talk to your dietitian about how many servings of carbohydrates you can eat at each meal.  Eat 4-6 ounces of lean protein each day, such as lean meat, chicken, fish, eggs, or tofu. 1 ounce is equal to 1 ounce of meat, chicken, or fish, 1 egg, or 1/4 cup of tofu.  Eat some foods each day that contain healthy fats, such as avocado, nuts, seeds, and fish. Lifestyle   Check your blood glucose regularly.  Exercise at least 30 minutes 5 or more days each week, or as told by your health care provider.  Take medicines as told by your health care provider.  Do not use any products that contain nicotine or tobacco, such as cigarettes and e-cigarettes. If you need help quitting, ask your health care provider.  Work with a counselor or diabetes educator to identify strategies to manage stress and any emotional and social challenges. What are some questions to ask my health care provider?  Do I need to meet with a diabetes educator?  Do I need to meet with a dietitian?  What number can I call if I have questions?  When are the best times to check my blood glucose? Where to find more information:  American Diabetes Association: diabetes.org/food-and-fitness/food  Academy of Nutrition and Dietetics:  www.eatright.org/resources/health/diseases-and-conditions/diabetes  National Institute of Diabetes and Digestive and Kidney Diseases (NIH): www.niddk.nih.gov/health-information/diabetes/overview/diet-eating-physical-activity Summary  A healthy meal plan will help you control your blood glucose and maintain a healthy lifestyle.  Working with a diet and nutrition specialist (dietitian) can help you make a meal plan that is best for you.  Keep in mind that carbohydrates and alcohol have immediate effects on your blood glucose levels. It is important to count carbohydrates and to use alcohol carefully. This information is not intended to replace advice given to you by your health care provider. Make sure you discuss any questions you have with your health care provider. Document Released: 02/14/2005 Document Revised: 06/24/2016 Document Reviewed: 06/24/2016 Elsevier Interactive Patient Education  2018 [employer]  

## 2017-07-20 NOTE — Progress Notes (Signed)
Patient discharged home. Discharge instructions were reviewed with the patient. Patient verbalized understanding.  

## 2017-07-22 LAB — GLUCOSE, CAPILLARY: Glucose-Capillary: 236 mg/dL — ABNORMAL HIGH (ref 65–99)

## 2017-07-24 NOTE — Discharge Summary (Signed)
[facility] Discharge Summary   Patient: Candice Fernandez LHT:[id number]   PCP: Patient, No Pcp Per DOB: May 12, 1976   Date of admission: 07/18/2017   Date of discharge: 07/20/2017   Discharge Diagnoses:  Principal Problem:   DKA, type 2 ([place]) Active Problems:   Normocytic normochromic anemia   Essential hypertension   HLD (hyperlipidemia)   DKA (diabetic ketoacidoses) ([place])   Diabetes mellitus due to underlying condition, uncontrolled ([place])   Admitted From: home Disposition:  home  Recommendations for Outpatient Follow-up:  1. Please follow-up with PCP in 1 week.  Maintain logs of your blood glucose levels and take it to your doctor's appointment.  Follow-up Information    PCP. Schedule an appointment as soon as possible for a visit in 1 week(s).        [facility] Follow up.   Why:  Please call for appointment or go on Thursday mornings at 8am to establish care.  Pharmacy is at this location. Contact information: [place] [id number] [facility].   Specialty:  Internal Medicine Why:  Please call for appointment.  If you establish care here, you may use pharmacy at [facility] [place]. Contact information: [place] [facility] up.   Why:  You are eligible for care here.  Please call for appointment.  If you establish care here you can use the pharmacy at [facility]. Contact information: [place] [phone]         Diet recommendation: Carb modified diet  Activity: The patient is advised to gradually reintroduce usual activities.  Discharge Condition: good  Code Status: Full code  History of present illness: As per the H and P dictated on admission, "Dandra Shambaugh  is a 42 y.o. female with history of recently diagnosed diabetes mellitus type 2 months ago and newborn went patient states she is in diabetic coma and was eventually discharged home on Lantus which patient states she takes every morning presents to the ER after patient has been a left lower quadrant abdominal pain last few days which has been worsening denies any associated nausea vomiting diarrhea.  Denies any fever chills.  ED Course: In the ER CT renal study shows nothing acute.  Patient's lab work show patient's blood sugar is around 600 with a anion gap 13.  Patient was started on IV fluids and IV insulin infusion.  UA was unremarkable.  Patient is being admitted for uncontrolled diabetes mellitus with early DKA."  Hospital Course:  Summary of her active problems in the hospital is as following. 1.  Type 2 diabetes mellitus. Uncontrolled with hyperglycemia. Hemoglobin A1c 11.7 CBG remained elevated, patient was taking 60 units of Lantus dose increased to 70 units. Patient was also given pre-meal coverage along with sliding scale coverage. Educated patient about importance of diet control. New prescription for blood glucose meter as well as then certain strips were also given. Case management was consulted for assistance with medication.  2.  HTN. Blood pressure control. Continue lisinopril.  3.  Dyslipidemia. Continue statin.  4.  History of alcohol abuse. Patient was on CIWA, no evidence of withdrawal.  All other chronic medical condition were stable during the hospitalization.  Patient was ambulatory without any assistance. On the  day of the discharge the patient's vitals were stable, and no other acute medical condition were reported by patient. the patient was felt safe to be discharge at home with family.  Procedures and Results:  none   Consultations:  none  DISCHARGE MEDICATION: Allergies as of 07/20/2017   No Known Allergies     Medication List    TAKE  these medications   ARIPiprazole 30 MG tablet Commonly known as:  ABILIFY Take 30 mg by mouth daily.   atorvastatin 40 MG tablet Commonly known as:  LIPITOR Take 40 mg by mouth daily.   blood glucose meter kit and supplies Dispense based on patient and insurance preference. Use up to four times daily as directed. (FOR ICD-10 E10.9, E11.9).   cetirizine 10 MG tablet Commonly known as:  ZYRTEC Take 10 mg by mouth daily.   gabapentin 800 MG tablet Commonly known as:  NEURONTIN Take 800 mg by mouth 3 (three) times daily.   ibuprofen 200 MG tablet Commonly known as:  ADVIL,MOTRIN Take 800 mg by mouth every 6 (six) hours as needed for moderate pain.   insulin aspart 100 UNIT/ML injection Commonly known as:  novoLOG Inject into the skin 3 (three) times daily before meals. What changed:  Another medication with the same name was added. Make sure you understand how and when to take each.   insulin aspart 100 UNIT/ML injection Commonly known as:  novoLOG Inject 15 Units into the skin 3 (three) times daily with meals. What changed:  You were already taking a medication with the same name, and this prescription was added. Make sure you understand how and when to take each.   insulin glargine 100 UNIT/ML injection Commonly known as:  LANTUS Inject 0.7 mLs (70 Units total) into the skin every morning. What changed:  how much to take   INSULIN SYRINGE 1CC/31GX5/16" 31G X 5/16" 1 ML Misc 1 Act by Does not apply route daily.   INSULIN SYRINGE .3CC/31GX5/16" 31G X 5/16" 0.3 ML Misc 1 Act by Does not apply route 4 (four) times daily -  before meals and at bedtime.   lisinopril 10 MG tablet Commonly known as:  PRINIVIL,ZESTRIL Take 10 mg by mouth daily.   methocarbamol 500 MG tablet Commonly known as:  ROBAXIN Take 500 mg by mouth every 6 (six) hours as needed for muscle spasms.   mirtazapine 30 MG tablet Commonly known as:  REMERON Take 30 mg by mouth at bedtime.   multivitamin  with minerals Tabs tablet Take 1 tablet by mouth daily.   traMADol 50 MG tablet Commonly known as:  ULTRAM Take 50 mg by mouth every 6 (six) hours as needed for moderate pain.   vitamin B-1 250 MG tablet Take 250 mg by mouth daily.      No Known Allergies Discharge Instructions    Diet Carb Modified   Complete by:  As directed    Discharge instructions   Complete by:  As directed    It is important that you read following instructions as well as go over your medication list with RN to help you understand your care after this hospitalization.  Discharge Instructions: Please follow-up with PCP in one week  Please request your primary care physician to go over all Hospital Tests and Procedure/Radiological results at the follow up,  Please get all Hospital records sent to your PCP by signing hospital release before you go home.   Do not take more than prescribed Pain, Sleep and Anxiety  Medications. You were cared for by a hospitalist during your hospital stay. If you have any questions about your discharge medications or the care you received while you were in the hospital after you are discharged, you can call the unit and ask to speak with the hospitalist on call if the hospitalist that took care of you is not available.  Once you are discharged, your primary care physician will handle any further medical issues. Please note that NO REFILLS for any discharge medications will be authorized once you are discharged, as it is imperative that you return to your primary care physician (or establish a relationship with a primary care physician if you do not have one) for your aftercare needs so that they can reassess your need for medications and monitor your lab values. You Must read complete instructions/literature along with all the possible adverse reactions/side effects for all the Medicines you take and that have been prescribed to you. Take any new Medicines after you have completely  understood and accept all the possible adverse reactions/side effects. Wear Seat belts while driving. If you have smoked or chewed Tobacco in the last 2 yrs please stop smoking and/or stop any Recreational drug use.   Increase activity slowly   Complete by:  As directed      Discharge Exam: There were no vitals filed for this visit. Vitals:   07/20/17 0812 07/20/17 1012  BP: 108/75 96/72  Pulse:  (!) 108  Resp:  17  Temp:  98.4 F (36.9 C)  SpO2:  94%   General: Appear in no distress, no Rash; Oral Mucosa moist. Cardiovascular: S1 and S2 Present, no Murmur, no JVD Respiratory: Bilateral Air entry present and Clear to Auscultation, no Crackles, no wheezes Abdomen: Bowel Sound present, Soft and no tenderness Extremities: no Pedal edema, no calf tenderness Neurology: Grossly no focal neuro deficit.  The results of significant diagnostics from this hospitalization (including imaging, microbiology, ancillary and laboratory) are listed below for reference.    Significant Diagnostic Studies: US Pelvis Transvanginal Non-ob (tv Only)  Result Date: 07/18/2017 CLINICAL DATA:  Pain EXAM: TRANSABDOMINAL AND TRANSVAGINAL ULTRASOUND OF PELVIS TECHNIQUE: Both transabdominal and transvaginal ultrasound examinations of the pelvis were performed. Transabdominal technique was performed for global imaging of the pelvis including uterus, ovaries, adnexal regions, and pelvic cul-de-sac. It was necessary to proceed with endovaginal exam following the transabdominal exam to visualize the uterus, endometrium, ovaries and adnexa. COMPARISON:  CT earlier today FINDINGS: Uterus Measurements: 8.3 x 5.4 x 5.8 cm. Heterogeneous echotexture. No visible fibroids. No calcified fibroid visible by ultrasound as seen on CT. Endometrium Thickness: Normal thickness, 4 mm.  No focal abnormality visualized. Right ovary Measurements: 1.9 x 1.1 x 1.0 cm. Normal appearance/no adnexal mass. Left ovary Measurements: 3.6 x 2.0 x 2.3  cm. Dominant follicle. Normal appearance/no adnexal mass. Other findings No abnormal free fluid. IMPRESSION: Mild heterogeneous echotexture throughout the uterus. Calcified fibroid seen on earlier CT not visible by ultrasound. No acute findings. Electronically Signed   By: Rolm Baptise M.D.   On: 07/18/2017 11:06   US Pelvis (transabdominal Only)  Result Date: 07/18/2017 CLINICAL DATA:  Pain EXAM: TRANSABDOMINAL AND TRANSVAGINAL ULTRASOUND OF PELVIS TECHNIQUE: Both transabdominal and transvaginal ultrasound examinations of the pelvis were performed. Transabdominal technique was performed for global imaging of the pelvis including uterus, ovaries, adnexal regions, and pelvic cul-de-sac. It was necessary to proceed with endovaginal exam following the transabdominal exam to visualize the uterus, endometrium, ovaries and adnexa. COMPARISON:  CT  earlier today FINDINGS: Uterus Measurements: 8.3 x 5.4 x 5.8 cm. Heterogeneous echotexture. No visible fibroids. No calcified fibroid visible by ultrasound as seen on CT. Endometrium Thickness: Normal thickness, 4 mm.  No focal abnormality visualized. Right ovary Measurements: 1.9 x 1.1 x 1.0 cm. Normal appearance/no adnexal mass. Left ovary Measurements: 3.6 x 2.0 x 2.3 cm. Dominant follicle. Normal appearance/no adnexal mass. Other findings No abnormal free fluid. IMPRESSION: Mild heterogeneous echotexture throughout the uterus. Calcified fibroid seen on earlier CT not visible by ultrasound. No acute findings. Electronically Signed   By: Rolm Baptise M.D.   On: 07/18/2017 11:06   Ct Renal Stone Study  Result Date: 07/18/2017 CLINICAL DATA:  Left-sided abdominal pain. EXAM: CT ABDOMEN AND PELVIS WITHOUT CONTRAST TECHNIQUE: Multidetector CT imaging of the abdomen and pelvis was performed following the standard protocol without IV contrast. COMPARISON:  None. FINDINGS: Lower chest: Linear atelectasis in the lung bases. No consolidation. No pleural effusion. Hepatobiliary:  No focal hepatic lesion allowing for lack contrast. Gallbladder is nondistended. No calcified gallstone. Pancreas: No ductal dilatation or inflammation. Spleen: Normal in size without focal abnormality. Adrenals/Urinary Tract: No adrenal nodule. No hydronephrosis or perinephric edema. No urolithiasis. Distended urinary bladder without wall thickening or stone. Stomach/Bowel: Stomach physiologically distended with ingested contents. Probable small duodenal diverticulum. No small bowel obstruction, wall thickening or inflammation. Normal appendix. Moderate colonic stool burden with colonic tortuosity. No colonic wall thickening or inflammation. Vascular/Lymphatic: Minimal aortic atherosclerosis. No aneurysm. No bulky abdominal or pelvic adenopathy. Reproductive: Calcified fibroid about the left uterine fundus. No adnexal mass. Other: Soft tissue densities in the anterior abdominal wall typically seen with injection sites. Tiny fat containing umbilical hernia. No intra-abdominal ascites. No free air. No intra-abdominal abscess. Musculoskeletal: There are no acute or suspicious osseous abnormalities. IMPRESSION: 1. No renal stones or obstructive uropathy. 2. Moderate colonic stool burden, can be seen with constipation. 3. Calcified uterine fibroid. 4. Minimal Aortic Atherosclerosis (ICD10-I70.0). Electronically Signed   By: Jeb Levering M.D.   On: 07/18/2017 03:45    Microbiology: No results found for this or any previous visit (from the past 240 hour(s)).   Labs: CBC: Recent Labs  Lab 07/17/17 2119 07/18/17 0415 07/18/17 0420  WBC 4.9  --  5.2  HGB 10.8* 11.6* 10.1*  HCT 34.1* 34.0* 31.9*  MCV 81.8  --  80.4  PLT 350  --  223   Basic Metabolic Panel: Recent Labs  Lab 07/18/17 0950 07/18/17 1213 07/18/17 1647 07/19/17 0833 07/20/17 0956  NA 138 134* 131* 133* 132*  K 4.2 4.9 4.6 4.6 4.3  CL 107 104 101 99* 100*  CO2 20* 18* 21* 21* 21*  GLUCOSE 106* 304* 357* 436* 356*  BUN _0 CREATININE 0.66 0.66 0.73 0.82 0.78  CALCIUM 8.7* 8.7* 8.9 9.7 9.3  MG  --   --   --   --  1.8   Liver Function Tests: Recent Labs  Lab 07/17/17 2119  AST 44*  ALT 40  ALKPHOS 97  BILITOT 0.5  PROT 6.3*  ALBUMIN 3.3*   Recent Labs  Lab 07/17/17 2119  LIPASE 28   No results for input(s): AMMONIA in the last 168 hours. Cardiac Enzymes: Recent Labs  Lab 07/18/17 0420  TROPONINI <0.03   BNP (last 3 results) No results for input(s): BNP in the last 8760 hours. CBG: Recent Labs  Lab 07/19/17 1119 07/19/17 1638 07/19/17 2129 07/20/17 0745 07/20/17 1137  GLUCAP 255* 410* 266*  325* 236*   Time spent: 35 minutes  Signed:  Berle Mull  [facility] 07/20/2017, 5:40 PM

## 2018-02-25 IMAGING — US US PELVIS COMPLETE
1 series · 14 of 25 positions shown · non-contrast
Comparison: CT earlier today

CLINICAL DATA: Pain



[Series 1: us pelvis complete · 0.24mm/px · 64 acquisitions, 14 frames shown]
[im 1/64]
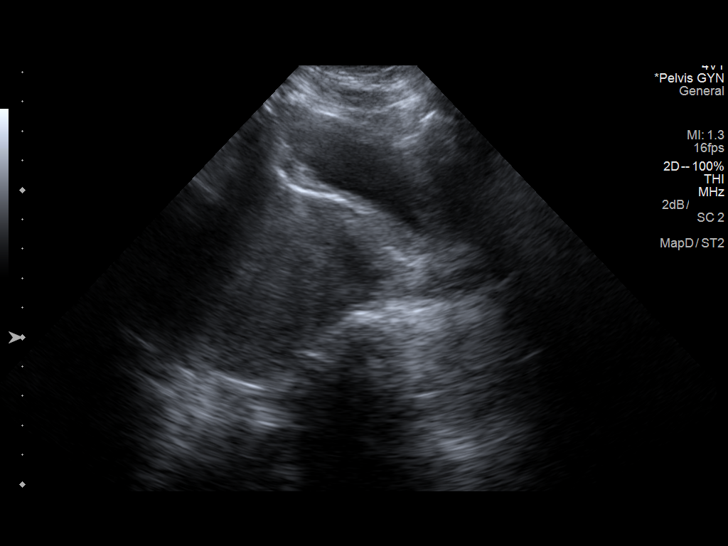
[im 6/64]
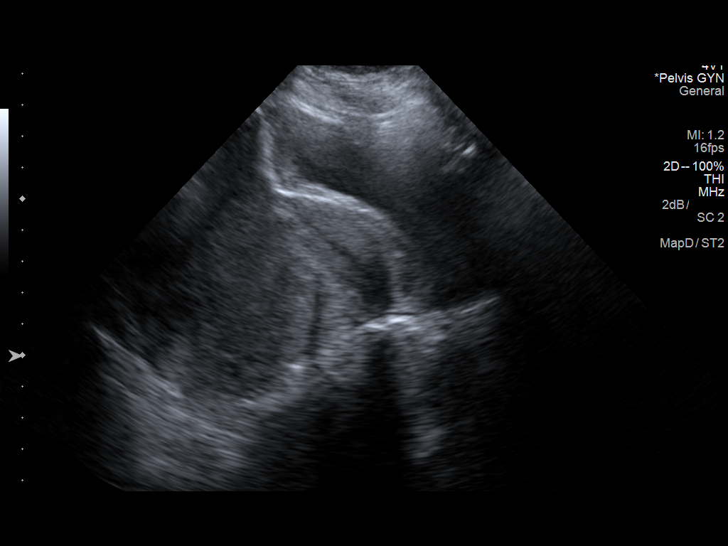
[im 11/64]
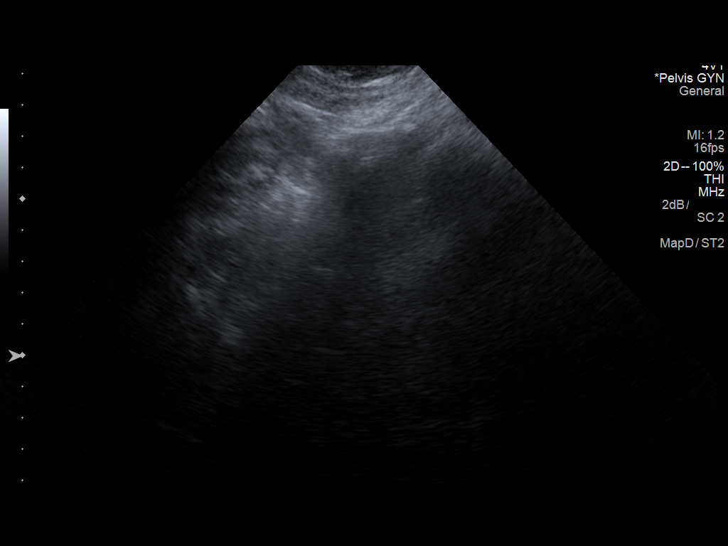
[im 16/64]
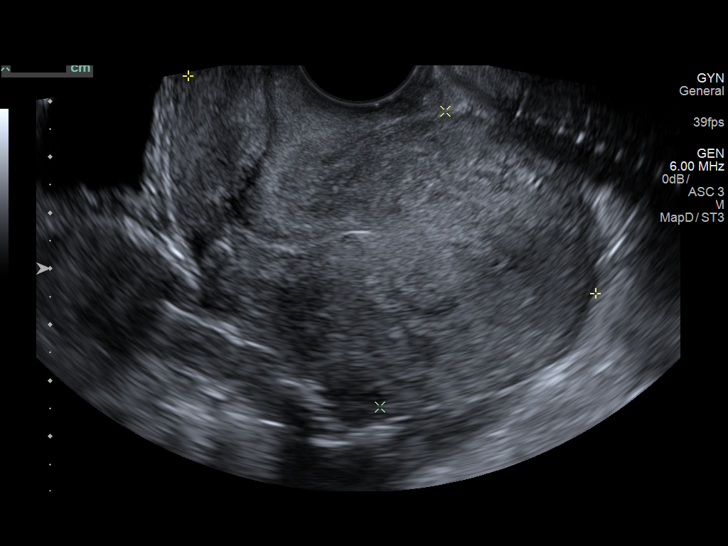
[im 22/64]
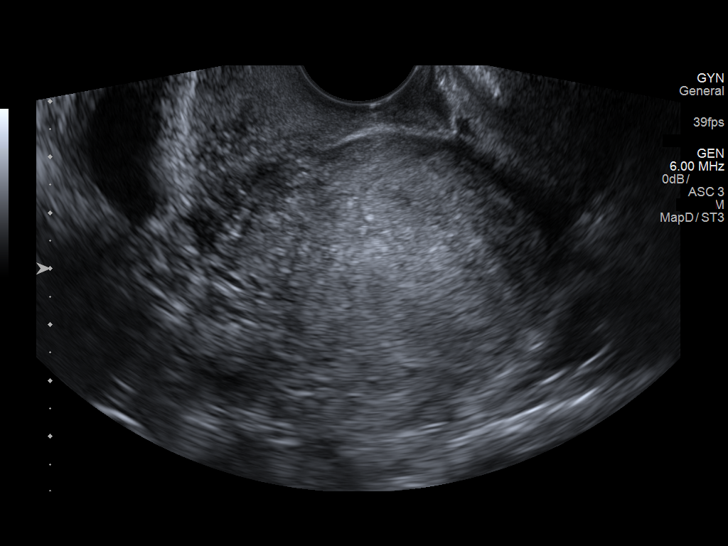
[im 24/64]
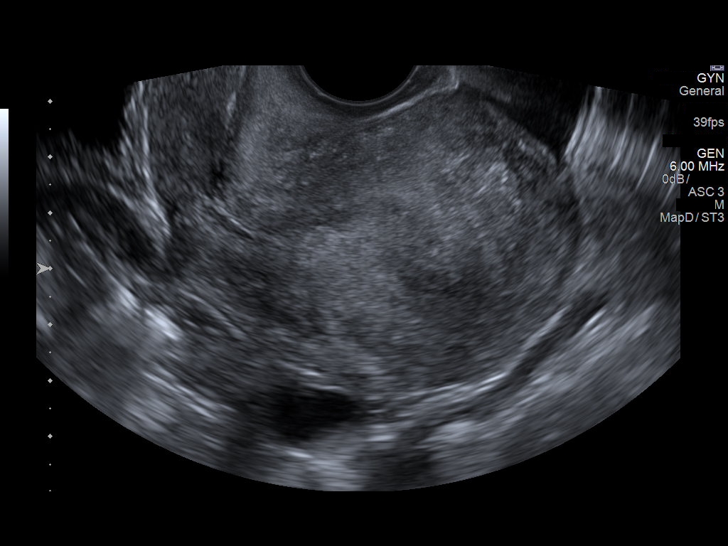
[im 29/64]
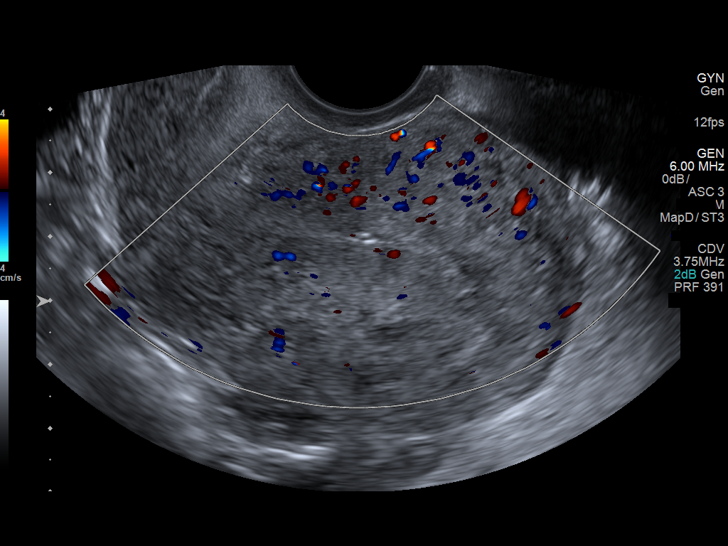
[im 35/64]
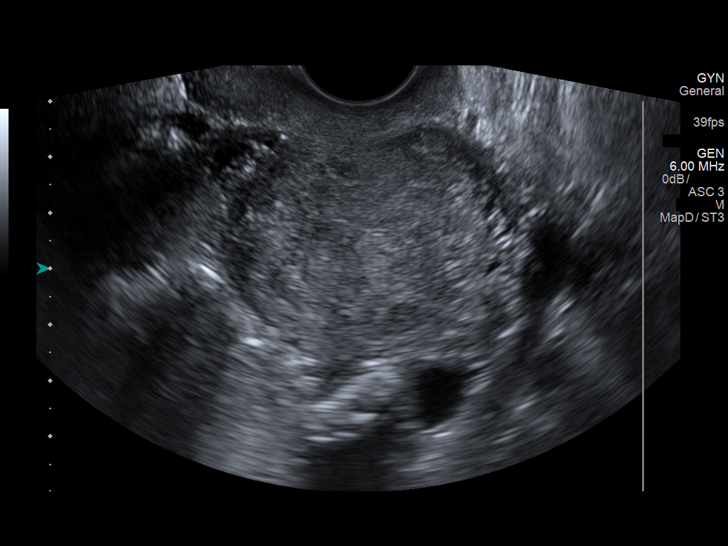
[im 40/64]
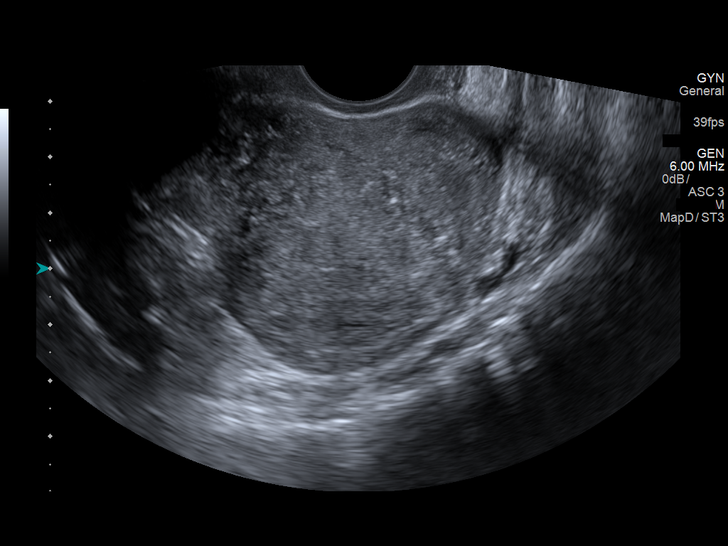
[im 43/64]
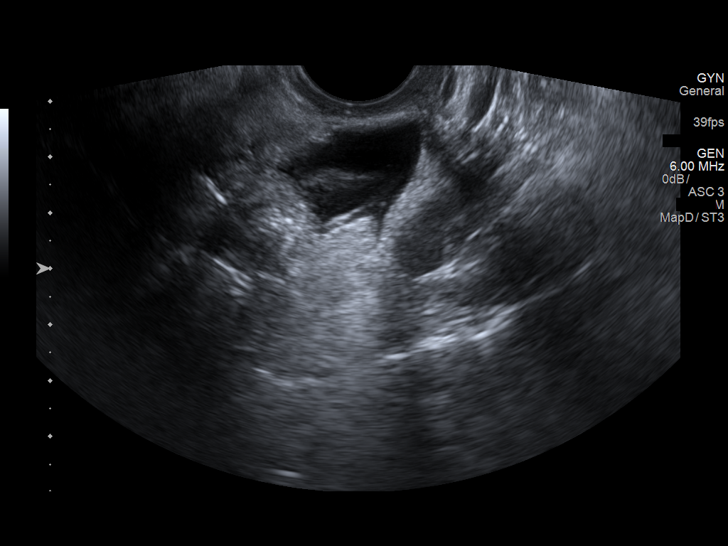
[im 48/64]
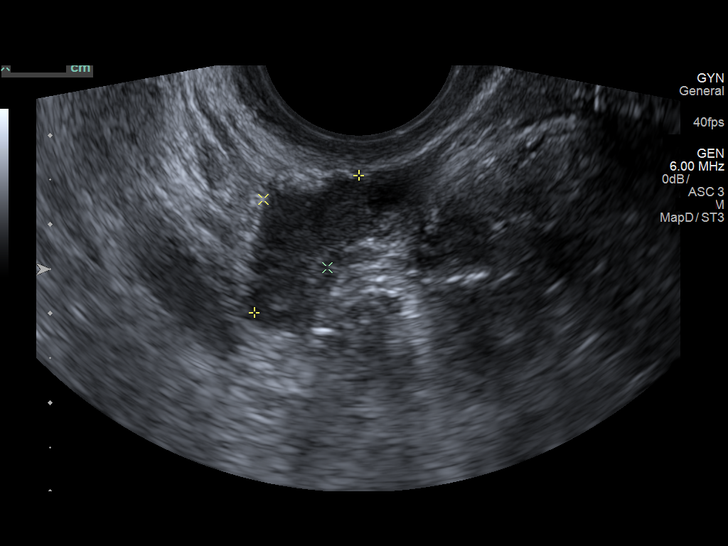
[im 53/64]
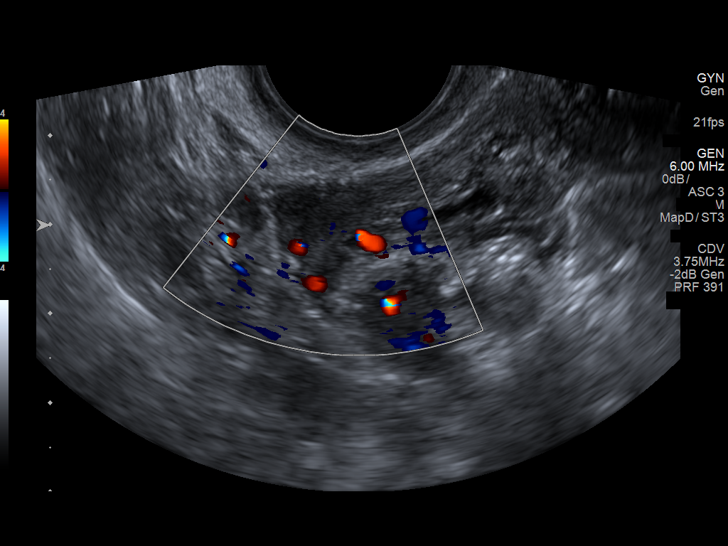
[im 58/64]
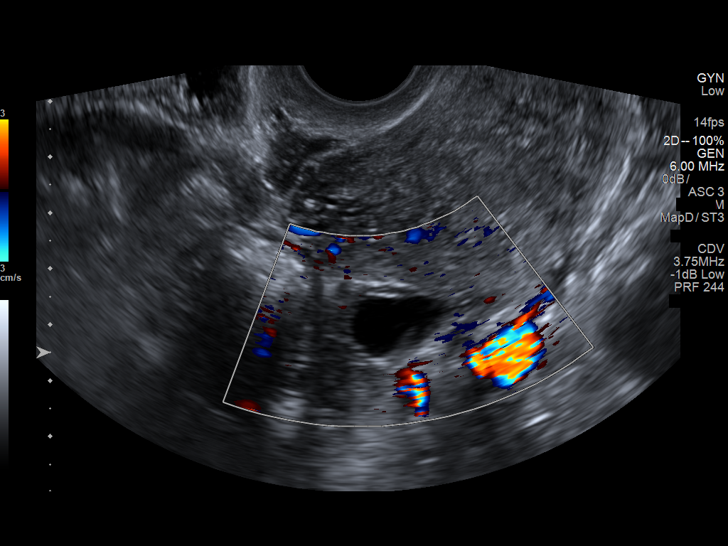
[im 64/64]
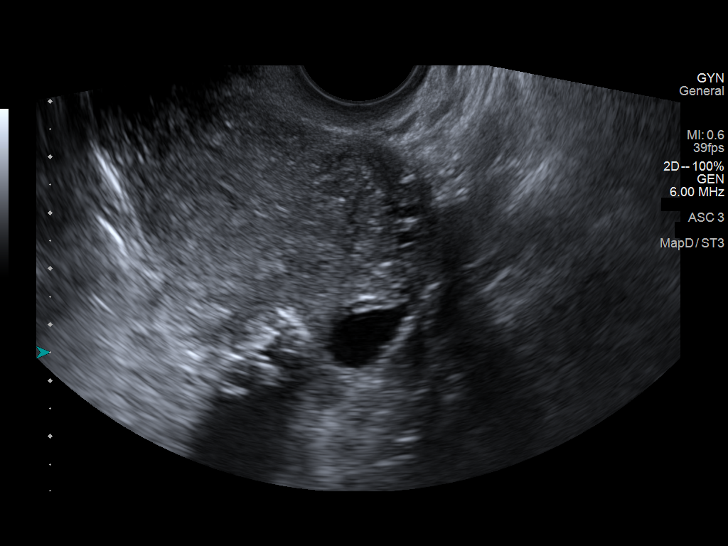

[14 of 25 positions shown; findings below may reference images not displayed]

FINDINGS: Uterus

Measurements: 8.3 x 5.4 x 5.8 cm. Heterogeneous echotexture. No
visible fibroids. No calcified fibroid visible by ultrasound as seen
on CT.

Endometrium

Thickness: Normal thickness, 4 mm.  No focal abnormality visualized.

Right ovary

Measurements: 1.9 x 1.1 x 1.0 cm. Normal appearance/no adnexal mass.

Left ovary

Measurements: 3.6 x 2.0 x 2.3 cm. Dominant follicle.. Normal
appearance/no adnexal mass.

Other findings

No abnormal free fluid.
IMPRESSION: Mild heterogeneous echotexture throughout the uterus. Calcified
fibroid seen on earlier CT not visible by ultrasound.

No acute findings.

## 2019-05-15 IMAGING — CT CT RENAL STONE PROTOCOL
2 of 4 series · 16 of 46 positions shown, 18 images · non-contrast
Comparison: None.

CLINICAL DATA: Left-sided abdominal pain.

EXAM:
CT ABDOMEN AND PELVIS WITHOUT CONTRAST
TECHNIQUE: Multidetector CT imaging of the abdomen and pelvis was performed
following the standard protocol without IV contrast.

[Series 3: ap without · axial · non-contrast · 0.88mm/px · z∈[+727,+1182]mm · 13 of 103 slices shown, 15 images]
[im 6/103  soft-tissue]
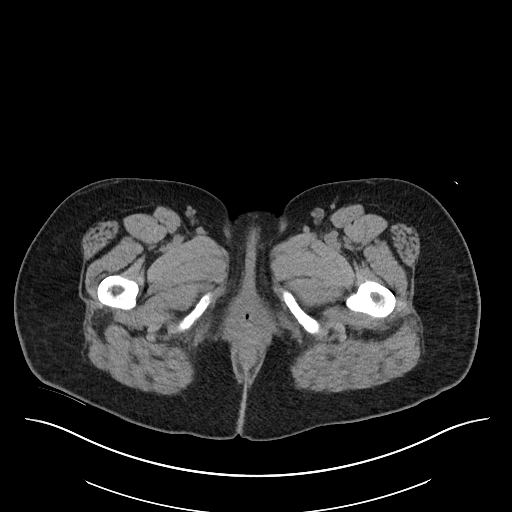
[im 6/103  bone]
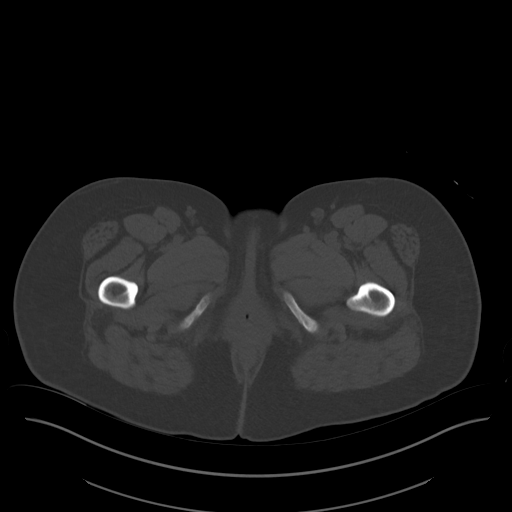
[im 12/103  soft-tissue]
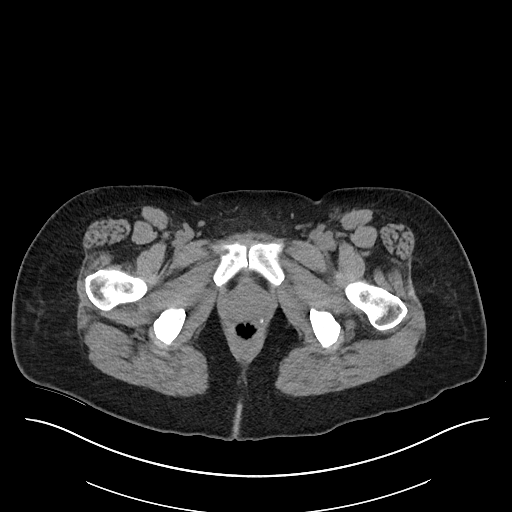
[im 23/103  soft-tissue]
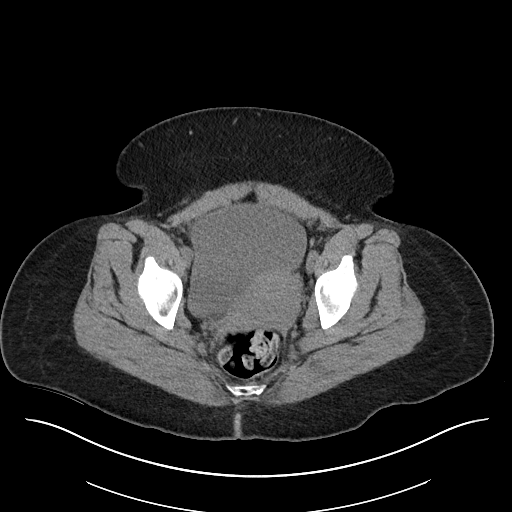
[im 29/103  soft-tissue]
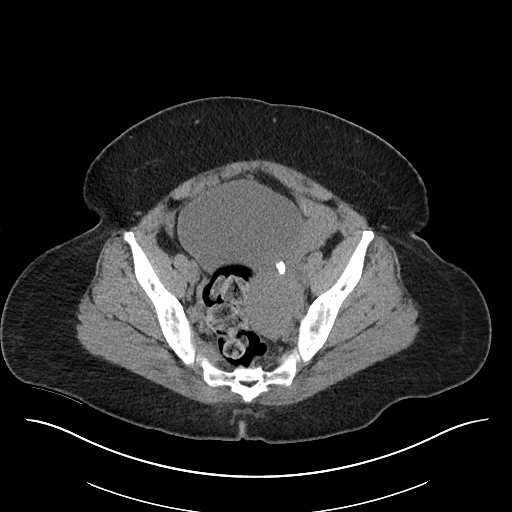
[im 35/103  soft-tissue]
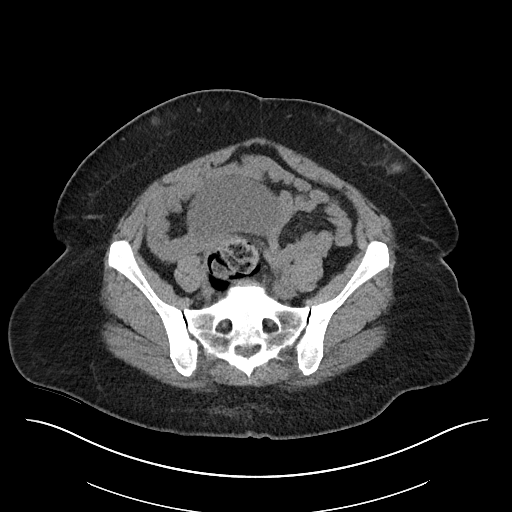
[im 46/103  soft-tissue]
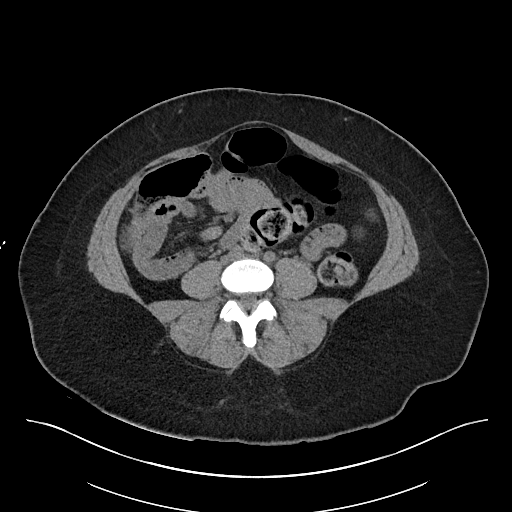
[im 52/103  soft-tissue]
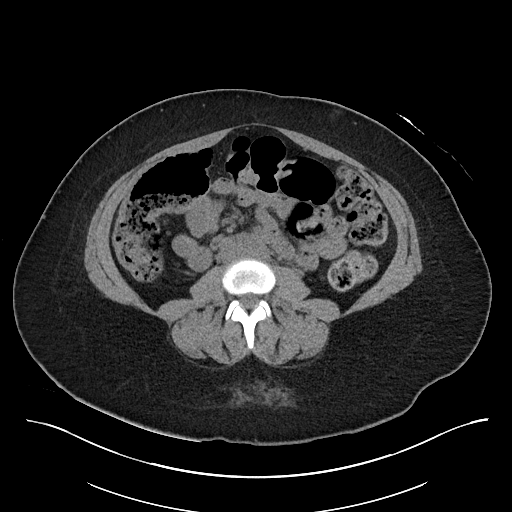
[im 57/103  soft-tissue]
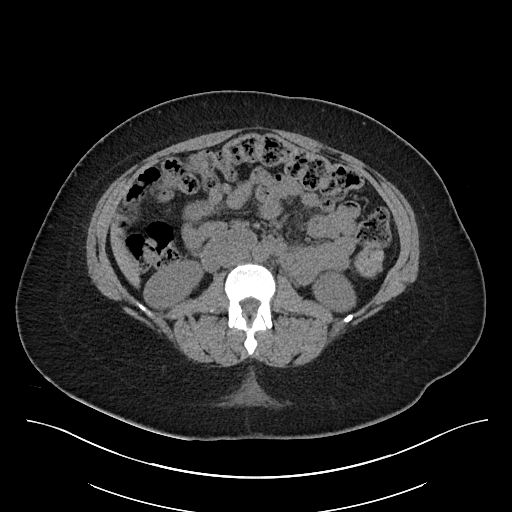
[im 69/103  soft-tissue]
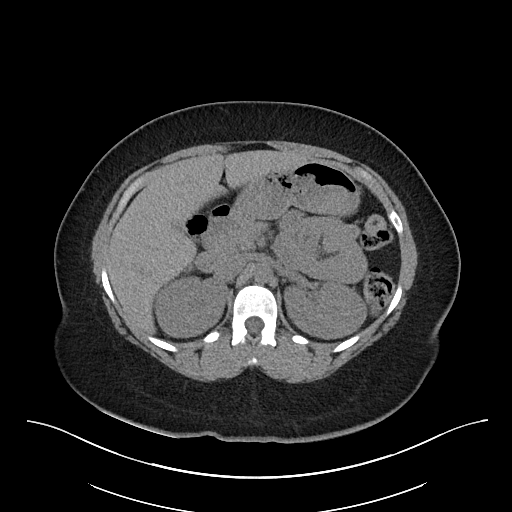
[im 69/103  bone]
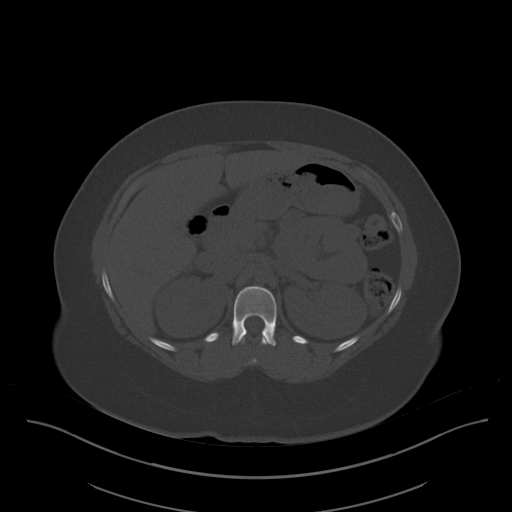
[im 74/103  soft-tissue]
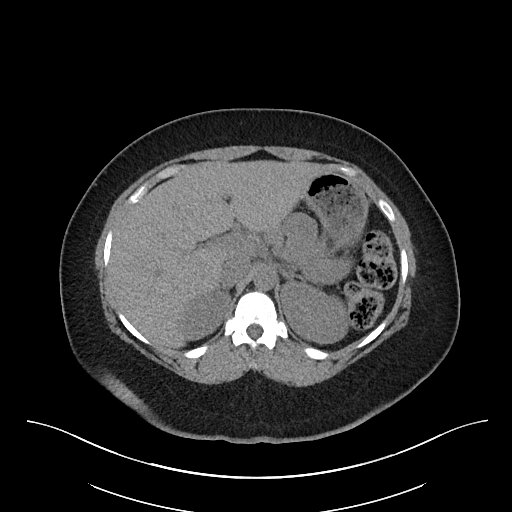
[im 80/103  soft-tissue]
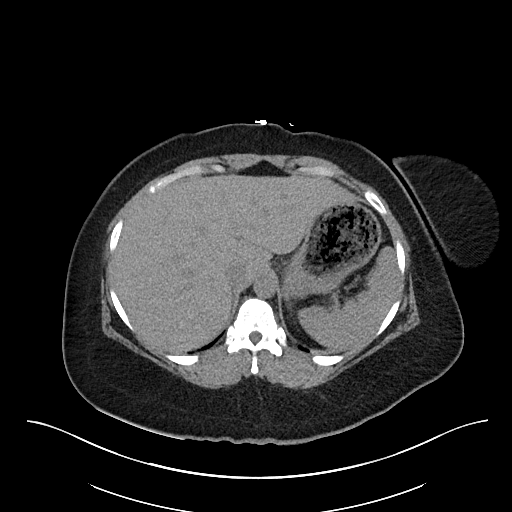
[im 91/103  soft-tissue]
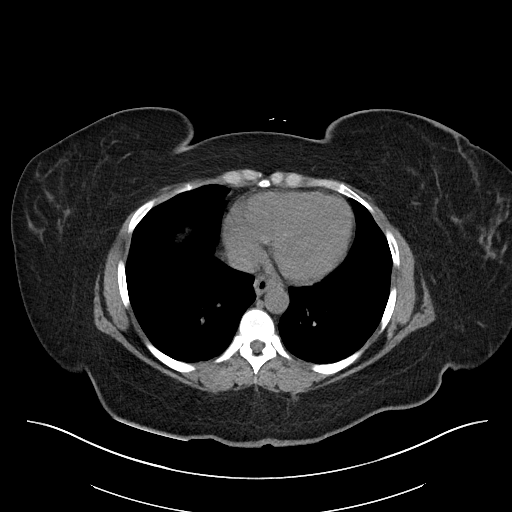
[im 97/103  soft-tissue]
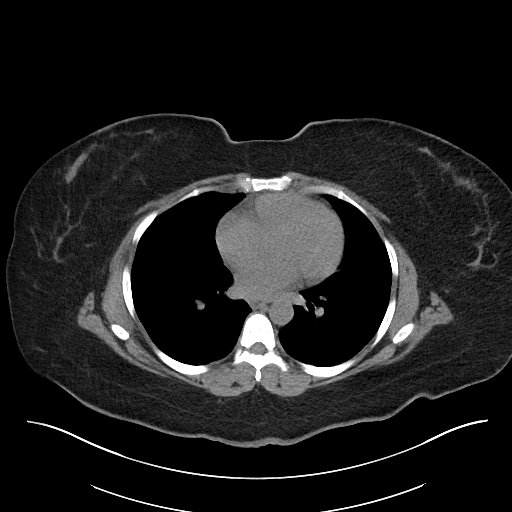

[Series 6: cor · coronal · 0.89mm/px · 3 of 101 slices shown]
[im 34/101  soft-tissue]
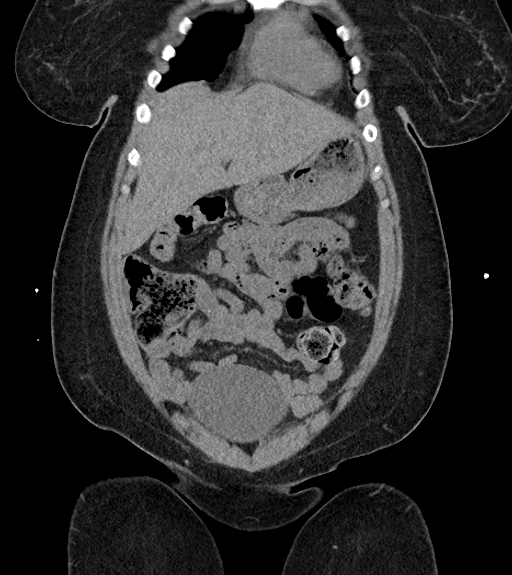
[im 45/101  soft-tissue]
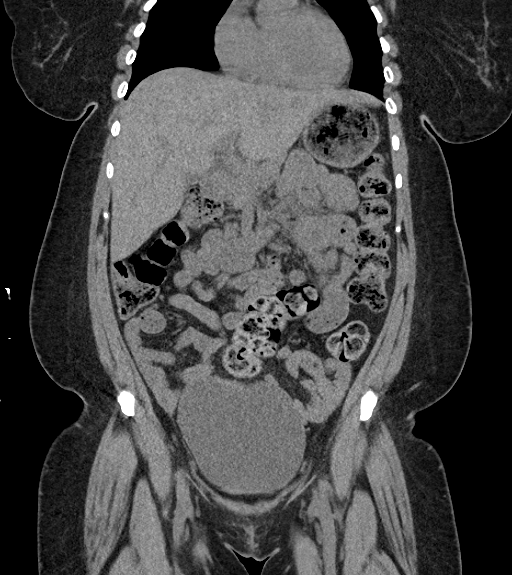
[im 56/101  soft-tissue]
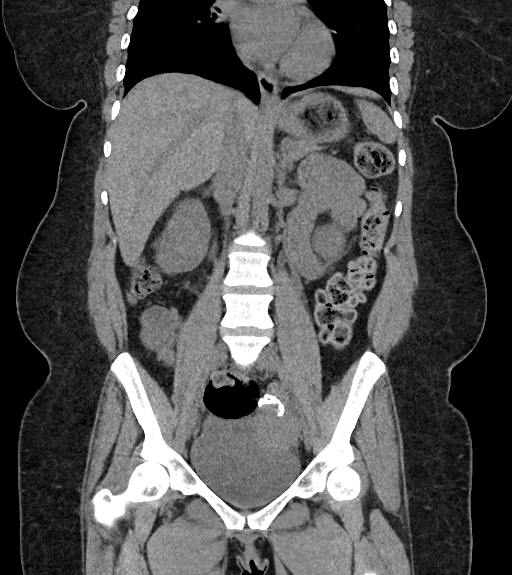

[16 of 46 positions shown; findings below may reference images not displayed]

FINDINGS: Lower chest: Linear atelectasis in the lung bases. No consolidation.
No pleural effusion.

Hepatobiliary: No focal hepatic lesion allowing for lack contrast.
Gallbladder is nondistended. No calcified gallstone.

Pancreas: No ductal dilatation or inflammation.

Spleen: Normal in size without focal abnormality.

Adrenals/Urinary Tract: No adrenal nodule. No hydronephrosis or
perinephric edema. No urolithiasis. Distended urinary bladder
without wall thickening or stone.

Stomach/Bowel: Stomach physiologically distended with ingested
contents. Probable small duodenal diverticulum. No small bowel
obstruction, wall thickening or inflammation. Normal appendix.
Moderate colonic stool burden with colonic tortuosity. No colonic
wall thickening or inflammation.

Vascular/Lymphatic: Minimal aortic atherosclerosis. No aneurysm. No
bulky abdominal or pelvic adenopathy.

Reproductive: Calcified fibroid about the left uterine fundus. No
adnexal mass.

Other: Soft tissue densities in the anterior abdominal wall
typically seen with injection sites. Tiny fat containing umbilical
hernia. No intra-abdominal ascites. No free air. No intra-abdominal
abscess.

Musculoskeletal: There are no acute or suspicious osseous
abnormalities.
IMPRESSION: 1. No renal stones or obstructive uropathy.
2. Moderate colonic stool burden, can be seen with constipation.
3. Calcified uterine fibroid.
4. Minimal Aortic Atherosclerosis ([id number]).
